# Patient Record
Sex: Female | Born: 1945 | Race: White | Hispanic: No | Marital: Married | Smoking: Former smoker
Health system: Southern US, Community
[De-identification: ages and names within clinical notes are randomized; demographics above are authoritative.]

## PROBLEM LIST (undated history)

## (undated) DIAGNOSIS — M810 Age-related osteoporosis without current pathological fracture: Secondary | ICD-10-CM

## (undated) DIAGNOSIS — R928 Other abnormal and inconclusive findings on diagnostic imaging of breast: Secondary | ICD-10-CM

## (undated) DIAGNOSIS — M48061 Spinal stenosis, lumbar region without neurogenic claudication: Secondary | ICD-10-CM

## (undated) DIAGNOSIS — E785 Hyperlipidemia, unspecified: Secondary | ICD-10-CM

## (undated) DIAGNOSIS — C801 Malignant (primary) neoplasm, unspecified: Secondary | ICD-10-CM

## (undated) DIAGNOSIS — R319 Hematuria, unspecified: Secondary | ICD-10-CM

## (undated) DIAGNOSIS — M1990 Unspecified osteoarthritis, unspecified site: Secondary | ICD-10-CM

## (undated) DIAGNOSIS — H409 Unspecified glaucoma: Secondary | ICD-10-CM

## (undated) DIAGNOSIS — I8393 Asymptomatic varicose veins of bilateral lower extremities: Secondary | ICD-10-CM

## (undated) DIAGNOSIS — M8580 Other specified disorders of bone density and structure, unspecified site: Secondary | ICD-10-CM

---

## 2003-08-01 HISTORY — PX: BREAST EXCISIONAL BIOPSY: SUR124

## 2004-09-20 ENCOUNTER — Ambulatory Visit: Payer: Self-pay | Admitting: Internal Medicine

## 2004-11-01 ENCOUNTER — Ambulatory Visit: Payer: Self-pay | Admitting: Gastroenterology

## 2005-06-19 IMAGING — MG MM BREAST SURGICAL SPECIMEN
1 series · 1 of 1 positions shown · non-contrast
Comparison: none

REASON FOR EXAM: specimen

Procedure: LEFT BREAST SPECIMEN
 Specimen reveals a Kopans hookwire in a small breast specimen. No definite
microcalcifications are noted. It is suggested that close follow-up
mammography be performed if repeat biopsy is not performed.

[L CC]
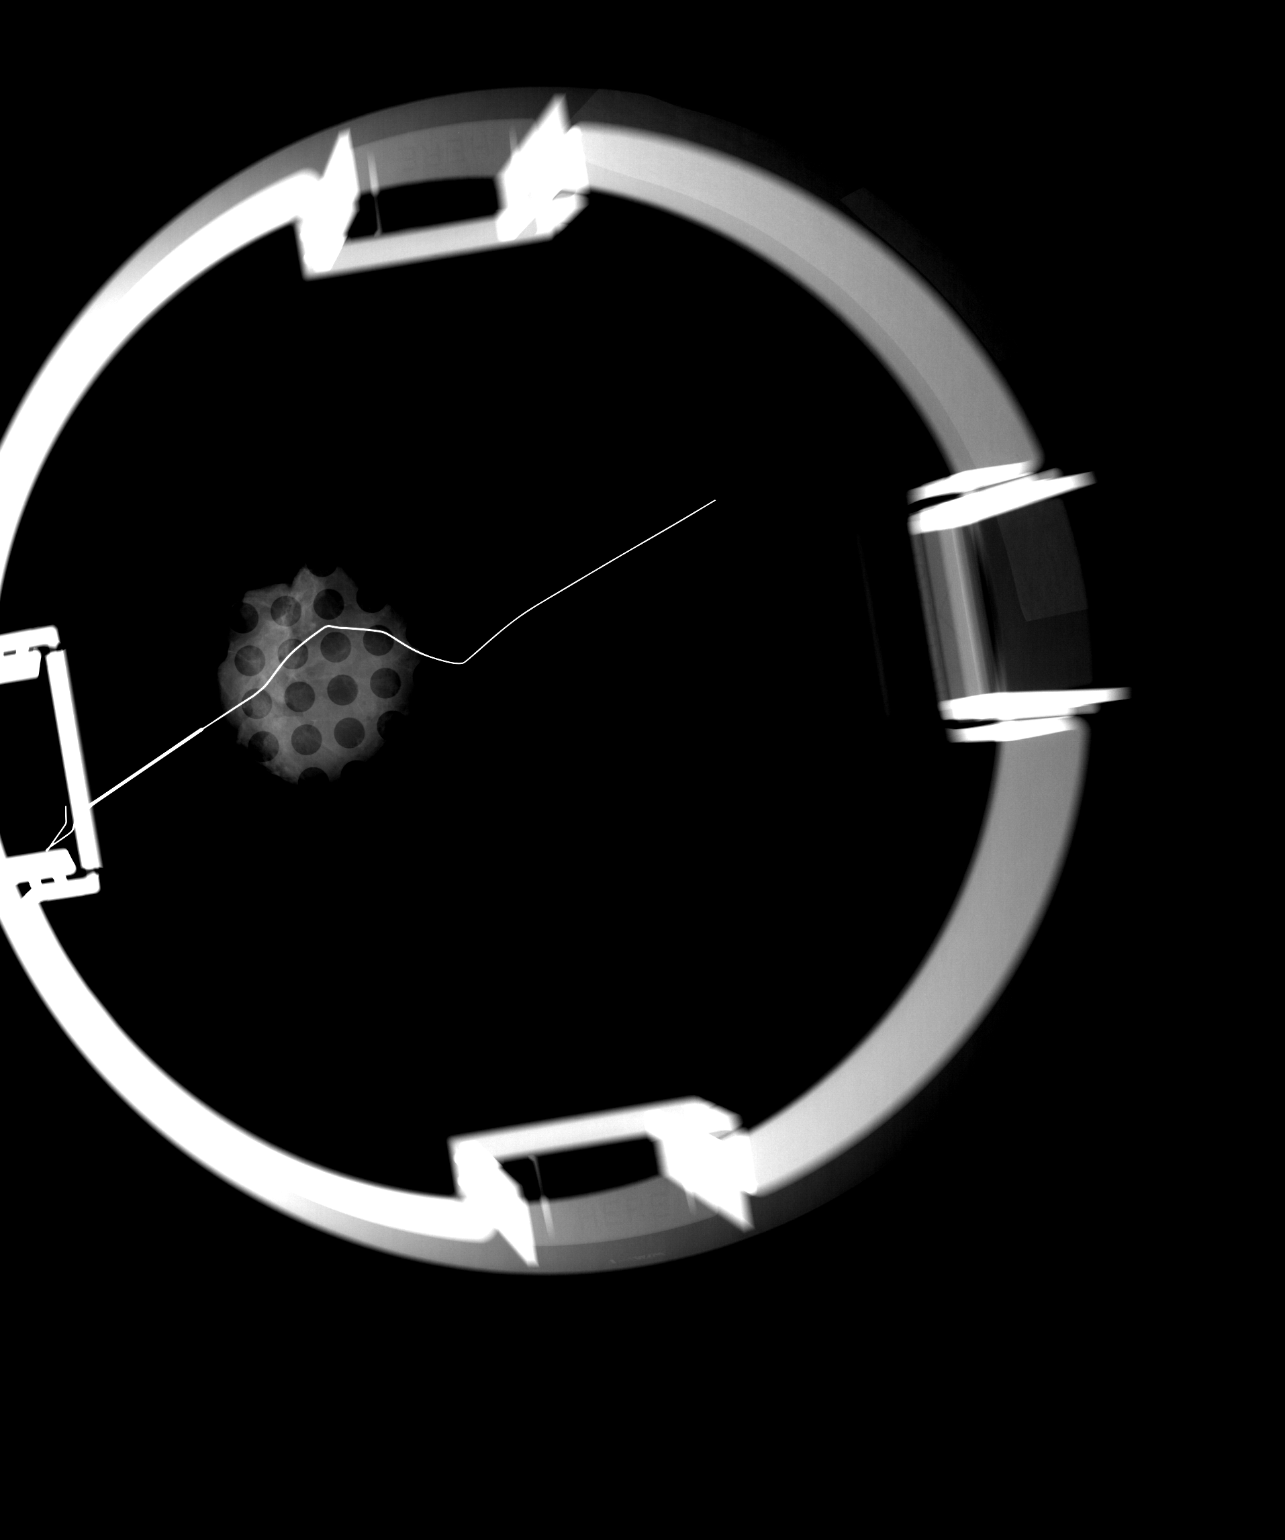

[1 of 1 positions shown; findings below may reference images not displayed]

## 2005-10-04 ENCOUNTER — Ambulatory Visit: Payer: Self-pay | Admitting: Internal Medicine

## 2006-10-09 ENCOUNTER — Ambulatory Visit: Payer: Self-pay | Admitting: Internal Medicine

## 2008-02-26 ENCOUNTER — Ambulatory Visit: Payer: Self-pay | Admitting: Internal Medicine

## 2008-11-18 ENCOUNTER — Ambulatory Visit: Payer: Self-pay | Admitting: Podiatry

## 2009-04-28 ENCOUNTER — Ambulatory Visit: Payer: Self-pay | Admitting: Internal Medicine

## 2010-01-11 ENCOUNTER — Ambulatory Visit: Payer: Self-pay | Admitting: Physician Assistant

## 2010-03-07 ENCOUNTER — Ambulatory Visit: Payer: Self-pay | Admitting: Gastroenterology

## 2010-05-03 ENCOUNTER — Ambulatory Visit: Payer: Self-pay | Admitting: Podiatry

## 2011-06-08 ENCOUNTER — Ambulatory Visit: Payer: Self-pay | Admitting: Internal Medicine

## 2012-06-13 ENCOUNTER — Ambulatory Visit: Payer: Self-pay | Admitting: Internal Medicine

## 2013-06-16 ENCOUNTER — Ambulatory Visit: Payer: Self-pay | Admitting: Internal Medicine

## 2014-06-17 ENCOUNTER — Ambulatory Visit: Payer: Self-pay | Admitting: Internal Medicine

## 2015-04-16 ENCOUNTER — Encounter: Payer: Self-pay | Admitting: *Deleted

## 2015-04-19 ENCOUNTER — Ambulatory Visit: Payer: Medicare Other | Admitting: Anesthesiology

## 2015-04-19 ENCOUNTER — Ambulatory Visit
Admission: RE | Admit: 2015-04-19 | Discharge: 2015-04-19 | Disposition: A | Discharge status: Home / Self Care | Payer: Medicare Other | Source: Ambulatory Visit | Attending: Gastroenterology | Admitting: Gastroenterology

## 2015-04-19 ENCOUNTER — Encounter: Payer: Self-pay | Admitting: Anesthesiology

## 2015-04-19 ENCOUNTER — Encounter
Admission: RE | Disposition: A | Discharge status: Home / Self Care | Payer: Self-pay | Source: Ambulatory Visit | Attending: Gastroenterology

## 2015-04-19 DIAGNOSIS — Z79899 Other long term (current) drug therapy: Secondary | ICD-10-CM | POA: Insufficient documentation

## 2015-04-19 DIAGNOSIS — K5520 Angiodysplasia of colon without hemorrhage: Secondary | ICD-10-CM | POA: Insufficient documentation

## 2015-04-19 DIAGNOSIS — Z86010 Personal history of colonic polyps: Secondary | ICD-10-CM | POA: Insufficient documentation

## 2015-04-19 DIAGNOSIS — Z800 Family history of malignant neoplasm of digestive organs: Secondary | ICD-10-CM | POA: Diagnosis not present

## 2015-04-19 DIAGNOSIS — H409 Unspecified glaucoma: Secondary | ICD-10-CM | POA: Insufficient documentation

## 2015-04-19 HISTORY — DX: Unspecified glaucoma: H40.9

## 2015-04-19 HISTORY — PX: COLONOSCOPY WITH PROPOFOL: SHX5780

## 2015-04-19 SURGERY — COLONOSCOPY WITH PROPOFOL
Anesthesia: General

## 2015-04-19 MED ORDER — SODIUM CHLORIDE 0.9 % IV SOLN
INTRAVENOUS | Status: DC
Start: 2015-04-19 — End: 2015-04-19

## 2015-04-19 MED ORDER — LIDOCAINE HCL (PF) 1 % IJ SOLN
2.0000 mL | Freq: Once | INTRAMUSCULAR | Status: AC
  Administered 2015-04-19: 0.03 mL via INTRADERMAL

## 2015-04-19 MED ORDER — LIDOCAINE HCL (CARDIAC) 20 MG/ML IV SOLN
INTRAVENOUS | Status: DC | PRN
  Administered 2015-04-19: 60 mg via INTRAVENOUS

## 2015-04-19 MED ORDER — LIDOCAINE HCL (PF) 1 % IJ SOLN
INTRAMUSCULAR | Status: AC
  Administered 2015-04-19: 0.03 mL via INTRADERMAL
  Filled 2015-04-19: qty 2

## 2015-04-19 MED ORDER — PROPOFOL 10 MG/ML IV BOLUS
INTRAVENOUS | Status: DC | PRN
  Administered 2015-04-19: 50 mg via INTRAVENOUS

## 2015-04-19 MED ORDER — PROPOFOL INFUSION 10 MG/ML OPTIME
INTRAVENOUS | Status: DC | PRN
Start: 2015-04-19 — End: 2015-04-19
  Administered 2015-04-19: 160 ug/kg/min via INTRAVENOUS

## 2015-04-19 MED ORDER — SODIUM CHLORIDE 0.9 % IV SOLN
INTRAVENOUS | Status: DC
Start: 2015-04-19 — End: 2015-04-19
  Administered 2015-04-19: 1000 mL via INTRAVENOUS

## 2015-04-19 NOTE — H&P (Signed)
    Primary Care Physician:  Adin Hector, MD Primary Gastroenterologist:  Dr. Candace Cruise  Pre-Procedure History & Physical: HPI:  Barbara Boyer is a 69 y.o. female is here for an colonoscopy.   Past Medical History  Diagnosis Date  . Glaucoma     No past surgical history on file.  Prior to Admission medications   Medication Sig Start Date End Date Taking? Authorizing Provider  Biotin 5000 MCG CAPS Take by mouth.   Yes Historical Provider, MD  timolol (BETIMOL) 0.5 % ophthalmic solution 1 drop 2 (two) times daily.   Yes Historical Provider, MD    Allergies as of 03/10/2015  . (Not on File)    No family history on file.  Social History   Social History  . Marital Status: Married    Spouse Name: N/A  . Number of Children: N/A  . Years of Education: N/A   Occupational History  . Not on file.   Social History Main Topics  . Smoking status: Not on file  . Smokeless tobacco: Not on file  . Alcohol Use: Not on file  . Drug Use: Not on file  . Sexual Activity: Not on file   Other Topics Concern  . Not on file   Social History Narrative  . No narrative on file    Review of Systems: See HPI, otherwise negative ROS  Physical Exam: BP 130/57 mmHg  Pulse 71  Temp(Src) 97.8 F (36.6 C) (Tympanic)  Resp 17  Ht 5\' 3"  (1.6 m)  Wt 70.761 kg (156 lb)  BMI 27.64 kg/m2  SpO2 100% General:   Alert,  pleasant and cooperative in NAD Head:  Normocephalic and atraumatic. Neck:  Supple; no masses or thyromegaly. Lungs:  Clear throughout to auscultation.    Heart:  Regular rate and rhythm. Abdomen:  Soft, nontender and nondistended. Normal bowel sounds, without guarding, and without rebound.   Neurologic:  Alert and  oriented x4;  grossly normal neurologically.  Impression/Plan: Barbara Boyer is here for an colonoscopy to be performed for persona hx of colon polyps and family hx of colon cancer.  Risks, benefits, limitations, and alternatives regarding colonoscopy have  been reviewed with the patient.  Questions have been answered.  All parties agreeable.   OH, Lupita Dawn, MD  04/19/2015, 8:31 AM

## 2015-04-19 NOTE — Anesthesia Preprocedure Evaluation (Signed)
Anesthesia Evaluation  Patient identified by MRN, date of birth, ID band Patient awake    Reviewed: Allergy & Precautions, H&P , NPO status , Patient's Chart, lab work & pertinent test results, reviewed documented beta blocker date and time   History of Anesthesia Complications Negative for: history of anesthetic complications  Airway Mallampati: III  TM Distance: >3 FB Neck ROM: full    Dental no notable dental hx. (+) Partial Lower 2 permanent bridges on the top left and right:   Pulmonary neg shortness of breath, neg sleep apnea, neg COPD, neg recent URI, former smoker,    Pulmonary exam normal breath sounds clear to auscultation       Cardiovascular Exercise Tolerance: Good negative cardio ROS Normal cardiovascular exam Rhythm:regular Rate:Normal     Neuro/Psych negative neurological ROS  negative psych ROS   GI/Hepatic negative GI ROS, Neg liver ROS,   Endo/Other  negative endocrine ROS  Renal/GU negative Renal ROS  negative genitourinary   Musculoskeletal   Abdominal   Peds  Hematology negative hematology ROS (+)   Anesthesia Other Findings Past Medical History:   Glaucoma                                                     Reproductive/Obstetrics negative OB ROS                             Anesthesia Physical Anesthesia Plan  ASA: II  Anesthesia Plan: General   Post-op Pain Management:    Induction:   Airway Management Planned:   Additional Equipment:   Intra-op Plan:   Post-operative Plan:   Informed Consent: I have reviewed the patients History and Physical, chart, labs and discussed the procedure including the risks, benefits and alternatives for the proposed anesthesia with the patient or authorized representative who has indicated his/her understanding and acceptance.   Dental Advisory Given  Plan Discussed with: Anesthesiologist, CRNA and  Surgeon  Anesthesia Plan Comments:         Anesthesia Quick Evaluation

## 2015-04-19 NOTE — Transfer of Care (Signed)
Immediate Anesthesia Transfer of Care Note  Patient: Barbara Boyer  Procedure(s) Performed: Procedure(s): COLONOSCOPY WITH PROPOFOL (N/A)  Patient Location: PACU  Anesthesia Type:General  Level of Consciousness: awake, alert  and oriented  Airway & Oxygen Therapy: Patient Spontanous Breathing and Patient connected to nasal cannula oxygen  Post-op Assessment: Report given to RN and Post -op Vital signs reviewed and stable  Post vital signs: Reviewed and stable  Last Vitals:  Filed Vitals:   04/19/15 0924  BP: 101/59  Pulse: 73  Temp: 36.4 C  Resp: 19    Complications: No apparent anesthesia complications

## 2015-04-19 NOTE — Op Note (Signed)
[facility] Gastroenterology Patient Name: Barbara Boyer Procedure Date: 04/19/2015 8:58 AM MRN: [id number] Account #: [id number] Date of Birth: [birth year] Admit Type: Outpatient Age: 69 Room: [facility] ENDO ROOM 4 Gender: Female Note Status: Finalized Procedure:         Colonoscopy Indications:       Family history of colon cancer in a first-degree relative,                     Personal history of colonic polyps Providers:         Lupita Dawn. Candace Cruise, MD Referring MD:      Ramonita Lab, MD (Referring MD) Medicines:         Monitored Anesthesia Care Complications:     No immediate complications. Procedure:         Pre-Anesthesia Assessment:                    - Prior to the procedure, a History and Physical was                     performed, and patient medications, allergies and                     sensitivities were reviewed. The patient's tolerance of                     previous anesthesia was reviewed.                    - The risks and benefits of the procedure and the sedation                     options and risks were discussed with the patient. All                     questions were answered and informed consent was obtained.                    - After reviewing the risks and benefits, the patient was                     deemed in satisfactory condition to undergo the procedure.                    After obtaining informed consent, the colonoscope was                     passed under direct vision. Throughout the procedure, the                     patient's blood pressure, pulse, and oxygen saturations                     were monitored continuously. The Olympus CF-H180AL                     colonoscope ( S#: [id number] ) was introduced through the                     anus and advanced to the the cecum, identified by                     appendiceal orifice and ileocecal valve. The colonoscopy  was performed without difficulty. The patient tolerated               the procedure well. The quality of the bowel preparation                     was good. Findings:      A single medium-sized angiodysplastic lesion without bleeding was found       in the cecum.      The exam was otherwise without abnormality. Impression:        - A single non-bleeding colonic angiodysplastic lesion.                    - The examination was otherwise normal.                    - No specimens collected. Recommendation:    - Discharge patient to home.                    - Repeat colonoscopy in 5 years for surveillance.                    - The findings and recommendations were discussed with the                     patient. Procedure Code(s): --- Professional ---                    [phone], Colonoscopy, flexible; diagnostic, including                     collection of specimen(s) by brushing or washing, when                     performed (separate procedure) Diagnosis Code(s): --- Professional ---                    K55.20, Angiodysplasia of colon without hemorrhage                    Z80.0, Family history of malignant neoplasm of digestive                     organs                    Z86.010, Personal history of colonic polyps CPT copyright 2014 American Medical Association. All rights reserved. The codes documented in this report are preliminary and upon coder review may  be revised to meet current compliance requirements. Hulen Luster, MD 04/19/2015 9:21:15 AM This report has been signed electronically. Number of Addenda: 0 Note Initiated On: 04/19/2015 8:58 AM Scope Withdrawal Time: 0 hours 3 minutes 29 seconds  Total Procedure Duration: 0 hours 7 minutes 45 seconds       [facility]

## 2015-04-20 NOTE — Anesthesia Postprocedure Evaluation (Signed)
  Anesthesia Post-op Note  Patient: Barbara Boyer  Procedure(s) Performed: Procedure(s): COLONOSCOPY WITH PROPOFOL (N/A)  Anesthesia type:General  Patient location: PACU  Post pain: Pain level controlled  Post assessment: Post-op Vital signs reviewed, Patient's Cardiovascular Status Stable, Respiratory Function Stable, Patent Airway and No signs of Nausea or vomiting  Post vital signs: Reviewed and stable  Last Vitals:  Filed Vitals:   04/19/15 1000  BP: 118/59  Pulse: 61  Temp:   Resp: 13    Level of consciousness: awake, alert  and patient cooperative  Complications: No apparent anesthesia complications

## 2015-04-21 ENCOUNTER — Encounter: Payer: Self-pay | Admitting: Gastroenterology

## 2015-04-22 ENCOUNTER — Other Ambulatory Visit: Payer: Self-pay | Admitting: Internal Medicine

## 2015-04-22 DIAGNOSIS — Z1231 Encounter for screening mammogram for malignant neoplasm of breast: Secondary | ICD-10-CM

## 2015-06-21 ENCOUNTER — Other Ambulatory Visit: Payer: Self-pay | Admitting: Internal Medicine

## 2015-06-21 ENCOUNTER — Ambulatory Visit
Admission: RE | Admit: 2015-06-21 | Discharge: 2015-06-21 | Disposition: A | Discharge status: Home / Self Care | Payer: Medicare Other | Source: Ambulatory Visit | Attending: Internal Medicine | Admitting: Internal Medicine

## 2015-06-21 DIAGNOSIS — Z1231 Encounter for screening mammogram for malignant neoplasm of breast: Secondary | ICD-10-CM | POA: Insufficient documentation

## 2016-05-18 ENCOUNTER — Other Ambulatory Visit: Payer: Self-pay | Admitting: Internal Medicine

## 2016-05-18 DIAGNOSIS — Z1231 Encounter for screening mammogram for malignant neoplasm of breast: Secondary | ICD-10-CM

## 2016-06-27 ENCOUNTER — Ambulatory Visit
Admission: RE | Admit: 2016-06-27 | Discharge: 2016-06-27 | Disposition: A | Discharge status: Home / Self Care | Payer: Medicare Other | Source: Ambulatory Visit | Attending: Internal Medicine | Admitting: Internal Medicine

## 2016-06-27 DIAGNOSIS — Z1231 Encounter for screening mammogram for malignant neoplasm of breast: Secondary | ICD-10-CM | POA: Insufficient documentation

## 2016-11-28 ENCOUNTER — Ambulatory Visit (INDEPENDENT_AMBULATORY_CARE_PROVIDER_SITE_OTHER): Payer: Medicare Other | Admitting: Vascular Surgery

## 2016-11-28 ENCOUNTER — Encounter (INDEPENDENT_AMBULATORY_CARE_PROVIDER_SITE_OTHER): Payer: Self-pay | Admitting: Vascular Surgery

## 2016-11-28 VITALS — BP 138/73 | HR 59 | Resp 16 | Ht 63.0 in | Wt 156.0 lb

## 2016-11-28 DIAGNOSIS — H409 Unspecified glaucoma: Secondary | ICD-10-CM | POA: Insufficient documentation

## 2016-11-28 DIAGNOSIS — I83813 Varicose veins of bilateral lower extremities with pain: Secondary | ICD-10-CM | POA: Diagnosis not present

## 2016-11-28 DIAGNOSIS — M7989 Other specified soft tissue disorders: Secondary | ICD-10-CM

## 2016-11-28 NOTE — Assessment & Plan Note (Signed)
Long standing and stable 

## 2016-11-28 NOTE — Patient Instructions (Signed)

## 2016-11-28 NOTE — Progress Notes (Signed)
Patient ID: Barbara Boyer, female   DOB: 09/24/1945, 71 y.o.   MRN: [id number]  Chief Complaint  Patient presents with  . New Evaluation    Varicose Veins    HPI Barbara Boyer is a 71 y.o. female. The patient presents with complaints of symptomatic varicosities of the Lower extremities bilaterally. The patient reports a long standing history of varicosities and they have become painful over time. She is actually seen to vascular specialists over the past 20 or 25 years and no intervention is recommended at this times but her symptoms have significantly worsened since the last time she was seen 10 or so years ago. There was no clear inciting event or causative factor that started the symptoms.  The left leg is more severly affected. The patient elevates the legs for relief. The pain is described as aching and stinging. The symptoms are generally most severe in the evening, particularly when they have been on their feet for long periods of time. Mild compression stockings and exercise has been used to try to improve the symptoms with some success. The patient complains of prominent swelling as an associated symptom most every day. The patient has no previous history of deep venous thrombosis or superficial thrombophlebitis to their knowledge.     Past Medical History:  Diagnosis Date  . Glaucoma     Past Surgical History:  Procedure Laterality Date  . BREAST EXCISIONAL BIOPSY Left 2005  . COLONOSCOPY WITH PROPOFOL N/A 04/19/2015   Procedure: COLONOSCOPY WITH PROPOFOL;  Surgeon: Hulen Luster, MD;  Location: [facility] ENDOSCOPY;  Service: Gastroenterology;  Laterality: N/A;    Family History  Problem Relation Age of Onset  . Breast cancer Maternal Aunt   . Breast cancer Maternal Aunt   No bleeding disorders, clotting disorders, or autoimmune diseases  Social History Social History  Substance Use Topics  . Smoking status: Former Research scientist (life sciences)  . Smokeless tobacco: Never Used     Comment: quit  at age 9  . Alcohol use 4.2 oz/week    7 Glasses of wine per week  No IV drug use Married  Allergies  Allergen Reactions  . Travatan Z [Travoprost]     Current Outpatient Prescriptions  Medication Sig Dispense Refill  . Biotin 5000 MCG CAPS Take by mouth.    . Calcium Carbonate-Vitamin D (CALCIUM 500 + D PO) Take by mouth.    . timolol (BETIMOL) 0.5 % ophthalmic solution 1 drop 2 (two) times daily.     No current facility-administered medications for this visit.       REVIEW OF SYSTEMS (Negative unless checked)  Constitutional: [] Weight loss  [] Fever  [] Chills Cardiac: [] Chest pain   [] Chest pressure   [] Palpitations   [] Shortness of breath when laying flat   [] Shortness of breath at rest   [] Shortness of breath with exertion. Vascular:  [] Pain in legs with walking   [x] Pain in legs at rest   [] Pain in legs when laying flat   [] Claudication   [] Pain in feet when walking  [] Pain in feet at rest  [] Pain in feet when laying flat   [] History of DVT   [] Phlebitis   [x] Swelling in legs   [x] Varicose veins   [] Non-healing ulcers Pulmonary:   [] Uses home oxygen   [] Productive cough   [] Hemoptysis   [] Wheeze  [] COPD   [] Asthma Neurologic:  [] Dizziness  [] Blackouts   [] Seizures   [] History of stroke   [] History of TIA  [] Aphasia   [] Temporary  blindness   [] Dysphagia   [] Weakness or numbness in arms   [] Weakness or numbness in legs Musculoskeletal:  [x] Arthritis   [] Joint swelling   [] Joint pain   [] Low back pain Hematologic:  [] Easy bruising  [] Easy bleeding   [] Hypercoagulable state   [] Anemic  [] Hepatitis Gastrointestinal:  [] Blood in stool   [] Vomiting blood  [] Gastroesophageal reflux/heartburn   [] Abdominal pain Genitourinary:  [] Chronic kidney disease   [] Difficult urination  [] Frequent urination  [] Burning with urination   [] Hematuria Skin:  [] Rashes   [] Ulcers   [] Wounds Psychological:  [] History of anxiety   []  History of major depression.    Physical Exam BP 138/73 (BP  Location: Right Arm)   Pulse (!) 59   Resp 16   Ht 5\' 3"  (1.6 m)   Wt 156 lb (70.8 kg)   BMI 27.63 kg/m  Gen:  WD/WN, NAD. Appears younger than stated age Head: [place]/AT, No temporalis wasting.  Ear/Nose/Throat: Hearing grossly intact, dentition good Eyes: Sclera non-icteric. Conjunctiva clear Neck: Supple, no nuchal rigidity. Trachea midline Pulmonary:  Good air movement, no use of accessory muscles, respirations not labored.  Cardiac: RRR, No JVD Vascular: Varicosities extensive and measuring up to 4-5 mm in the right lower extremity. Largest patches in the medial thigh area        Varicosities diffuse and measuring up to 4-5 mm in the left lower extremity. Largest patches in the posterior upper calf area Vessel Right Left  Radial Palpable Palpable  Ulnar Palpable Palpable  Brachial Palpable Palpable  Carotid Palpable, without bruit Palpable, without bruit  Aorta Not palpable N/A  Femoral Palpable Palpable  Popliteal Palpable Palpable  PT Palpable Palpable  DP Palpable Palpable   Gastrointestinal: soft, non-tender/non-distended.  Musculoskeletal: M/S 5/5 throughout.   1 + RLE edema.  1-2 + LLE edema Neurologic: Sensation grossly intact in extremities.  Symmetrical.  Speech is fluent.  Psychiatric: Judgment intact, Mood & affect appropriate for pt's clinical situation. Dermatologic: No rashes or ulcers noted.  No cellulitis or open wounds.    Radiology No results found.  Labs No results found for this or any previous visit (from the past 2160 hour(s)).  Assessment/Plan:  Glaucoma Longstanding and stable  Swelling of limb Likely from venous disease.  See treatment plan as below.  Varicose veins of leg with pain, bilateral See treatment plan as below.    The patient has symptoms consistent with chronic venous insufficiency. We discussed the natural history and treatment options for venous disease. I recommended the regular use of 20 - 30 mm Hg compression stockings,  and prescribed these today. I recommended leg elevation and anti-inflammatories as needed for pain. I have also recommended a complete venous duplex to assess the venous system for reflux or thrombotic issues. This can be done at the patient's convenience. I will see the patient back in 3 months to assess the response to conservative management, and determine further treatment options.     Leotis Pain 11/28/2016, 11:37 AM   This note was created with Dragon medical transcription system.  Any errors from dictation are unintentional.

## 2016-11-28 NOTE — Assessment & Plan Note (Signed)
Likely from venous disease.  See treatment plan as below.

## 2016-11-28 NOTE — Assessment & Plan Note (Signed)
See treatment plan as below 

## 2017-03-05 ENCOUNTER — Ambulatory Visit (INDEPENDENT_AMBULATORY_CARE_PROVIDER_SITE_OTHER): Payer: Medicare Other | Admitting: Vascular Surgery

## 2017-03-05 ENCOUNTER — Ambulatory Visit (INDEPENDENT_AMBULATORY_CARE_PROVIDER_SITE_OTHER): Payer: Medicare Other

## 2017-03-05 DIAGNOSIS — I83813 Varicose veins of bilateral lower extremities with pain: Secondary | ICD-10-CM | POA: Diagnosis not present

## 2017-04-06 ENCOUNTER — Encounter (INDEPENDENT_AMBULATORY_CARE_PROVIDER_SITE_OTHER): Payer: Self-pay | Admitting: Vascular Surgery

## 2017-04-06 ENCOUNTER — Ambulatory Visit (INDEPENDENT_AMBULATORY_CARE_PROVIDER_SITE_OTHER): Payer: Medicare Other | Admitting: Vascular Surgery

## 2017-04-06 VITALS — BP 141/75 | HR 71 | Resp 14 | Ht 62.0 in | Wt 153.0 lb

## 2017-04-06 DIAGNOSIS — H409 Unspecified glaucoma: Secondary | ICD-10-CM | POA: Diagnosis not present

## 2017-04-06 DIAGNOSIS — M7989 Other specified soft tissue disorders: Secondary | ICD-10-CM | POA: Diagnosis not present

## 2017-04-06 DIAGNOSIS — I83813 Varicose veins of bilateral lower extremities with pain: Secondary | ICD-10-CM | POA: Diagnosis not present

## 2017-04-06 NOTE — Patient Instructions (Signed)
Varicose Vein Surgery, Care After Refer to this sheet in the next few weeks. These instructions provide you with information about caring for yourself after your procedure. Your health care provider may also give you more specific instructions. Your treatment has been planned according to current medical practices, but problems sometimes occur. Call your health care provider if you have any problems or questions after your procedure. What can I expect after the procedure? After your procedure, it is typical to have the following:  Swelling.  Bruising.  Soreness.  Mild skin discoloration.  Slight bleeding at incision sites.  Follow these instructions at home:  Take medicines only as directed by your health care provider.  Wear compression stockings as directed by your health care provider. These stockings help to prevent blood clots and reduce swelling in your legs.  There are many different ways to close and cover an incision, including stitches (sutures), skin glue, and adhesive strips. Follow your health care provider's instructions on: ? Incision care. ? Bandage (dressing) changes and removal. ? Incision closure removal.  Wear loose-fitting clothing.  Get regular daily exercise. Walk or ride a stationary bike daily or as directed by your health care provider.  Ask your health care provider when you can return to work. This may depend on the type of work you do.  Be patient with your recovery. It can take up to 4 weeks to get back to your usual activities. Contact a health care provider if:  You have a fever.  You have drainage, redness, swelling, or pain at an incision site.  You develop a cough. Get help right away if:  You pass out.  You have very bad pain in your leg.  You have leg pain that gets worse when you walk.  You have redness or swelling in your leg that is getting worse.  You have trouble breathing.  You cough up blood. This information is not  intended to replace advice given to you by your health care provider. Make sure you discuss any questions you have with your health care provider. Document Released: 03/20/2014 Document Revised: 12/23/2015 Document Reviewed: 12/24/2013 Elsevier Interactive Patient Education  2018 [employer]  

## 2017-04-06 NOTE — Progress Notes (Signed)
Patient ID: Barbara Boyer, female   DOB: [birth year], 71 y.o.   MRN: [id number]  Chief Complaint  Patient presents with  . Follow-up    Ultrasound follow up    HPI Barbara Boyer is a 71 y.o. female.  Patient returns in follow up of their venous disease.  They have done their best to comply with the prescribed conservative therapies of compression stockings, leg elevation, exercise, and still requires anti-inflammatories for discomfort and has symptoms that are persistent and bothersome on a daily basis, affecting their activities of daily living and normal activities. Her symptom improvement is mild at best. Her left leg continues to have more discomfort and swelling. The venous reflux study demonstrates no DVT or superficial thrombophlebitis. Venous reflux was present in both small saphenous veins and the left great saphenous vein.         Past Medical History:  Diagnosis Date  . Glaucoma          Past Surgical History:  Procedure Laterality Date  . BREAST EXCISIONAL BIOPSY Left 2005  . COLONOSCOPY WITH PROPOFOL N/A 04/19/2015   Procedure: COLONOSCOPY WITH PROPOFOL;  Surgeon: Hulen Luster, MD;  Location: [facility] ENDOSCOPY;  Service: Gastroenterology;  Laterality: N/A;         Family History  Problem Relation Age of Onset  . Breast cancer Maternal Aunt   . Breast cancer Maternal Aunt   No bleeding disorders, clotting disorders, or autoimmune diseases  Social History        Social History   Substance Use Topics   . Smoking status: Former Research scientist (life sciences)   . Smokeless tobacco: Never Used      Comment: quit at age 78   . Alcohol use 4.2 oz/week    7 Glasses of wine per week  No IV drug use Married      Allergies  Allergen Reactions  . Travatan Z [Travoprost]           Current Outpatient Prescriptions  Medication Sig Dispense Refill  . Biotin 5000 MCG CAPS Take by mouth.    . Calcium Carbonate-Vitamin D (CALCIUM 500 + D PO) Take by mouth.    . timolol  (BETIMOL) 0.5 % ophthalmic solution 1 drop 2 (two) times daily.     No current facility-administered medications for this visit.       REVIEW OF SYSTEMS (Negative unless checked)  Constitutional: [] Weight loss  [] Fever  [] Chills Cardiac: [] Chest pain   [] Chest pressure   [] Palpitations   [] Shortness of breath when laying flat   [] Shortness of breath at rest   [] Shortness of breath with exertion. Vascular:  [] Pain in legs with walking   [x] Pain in legs at rest   [] Pain in legs when laying flat   [] Claudication   [] Pain in feet when walking  [] Pain in feet at rest  [] Pain in feet when laying flat   [] History of DVT   [] Phlebitis   [x] Swelling in legs   [x] Varicose veins   [] Non-healing ulcers Pulmonary:   [] Uses home oxygen   [] Productive cough   [] Hemoptysis   [] Wheeze  [] COPD   [] Asthma Neurologic:  [] Dizziness  [] Blackouts   [] Seizures   [] History of stroke   [] History of TIA  [] Aphasia   [] Temporary blindness   [] Dysphagia   [] Weakness or numbness in arms   [] Weakness or numbness in legs Musculoskeletal:  [x] Arthritis   [] Joint swelling   [] Joint pain   [] Low back pain Hematologic:  [] Easy bruising  []   Easy bleeding   [] Hypercoagulable state   [] Anemic  [] Hepatitis Gastrointestinal:  [] Blood in stool   [] Vomiting blood  [] Gastroesophageal reflux/heartburn   [] Abdominal pain Genitourinary:  [] Chronic kidney disease   [] Difficult urination  [] Frequent urination  [] Burning with urination   [] Hematuria Skin:  [] Rashes   [] Ulcers   [] Wounds Psychological:  [] History of anxiety   []  History of major depression.    Physical Exam BP (!) 141/75 (BP Location: Right Arm)   Pulse 71   Resp 14   Ht 5\' 2"  (1.575 m)   Wt 69.4 kg (153 lb)   BMI 27.98 kg/m  Gen:  WD/WN, NAD Head: [place]/AT, No temporalis wasting.  Ear/Nose/Throat: Hearing grossly intact, oropharynx clear Eyes: Sclera non-icteric. Conjunctiva clear Neck: Supple. Trachea midline Pulmonary:  Good air movement, no use of  accessory muscles, respirations not labored.  Cardiac: RRR, No JVD Vascular: Varicosities extensive and measuring up to 4-5 mm in the right lower extremity        Varicosities diffuse and measuring up to 4-5 mm in the left lower extremity Vessel Right Left  Radial Palpable Palpable  Ulnar Palpable Palpable  Brachial Palpable Palpable  Carotid Palpable, without bruit Palpable, without bruit  Aorta Not palpable N/A  Femoral Palpable Palpable  Popliteal Palpable Palpable  PT Palpable Palpable  DP Palpable Palpable    Musculoskeletal: M/S 5/5 throughout.   1 + RLE edema.  1-2 + LLE edema Neurologic: Sensation grossly intact in extremities.  Symmetrical.  Speech is fluent.  Psychiatric: Judgment intact, Mood & affect appropriate for pt's clinical situation. Dermatologic: No rashes or ulcers noted.  No cellulitis or open wounds.    Radiology No results found.  Labs No results found for this or any previous visit (from the past 2160 hour(s)).  Assessment/Plan: Glaucoma Longstanding and stable  Swelling of limb Likely from venous disease.  See treatment plan as below.  Varicose veins of leg with pain, bilateral   The patient has done their best to comply with conservative therapy of 20-30 mm Hg compression stockings, leg elevation, exercise, and anti-inflammatories as needed for discomfort.  Despite this, they continue to have daily and persistent symptoms from their venous disease.  A venous reflux study demonstrates bilateral SSV reflux and left GSV reflux.  As such, the patient is likely to benefit from endovenous laser ablation of the GSV on the left and the SSV bilaterally.  Risks and benefits of the procedure including bleeding, infection, recanalization, DVT, and need for further therapy for residual varicosities were discussed.  The patient voices their understanding and is agreeable to proceed.     Leotis Pain 04/06/2017, 4:37 PM

## 2017-05-22 ENCOUNTER — Encounter (INDEPENDENT_AMBULATORY_CARE_PROVIDER_SITE_OTHER): Payer: Self-pay | Admitting: Vascular Surgery

## 2017-05-22 ENCOUNTER — Ambulatory Visit (INDEPENDENT_AMBULATORY_CARE_PROVIDER_SITE_OTHER): Payer: Medicare Other | Admitting: Vascular Surgery

## 2017-05-22 VITALS — BP 125/79 | HR 70 | Resp 15 | Ht 62.0 in | Wt 154.0 lb

## 2017-05-22 DIAGNOSIS — I83813 Varicose veins of bilateral lower extremities with pain: Secondary | ICD-10-CM

## 2017-05-22 DIAGNOSIS — I83811 Varicose veins of right lower extremities with pain: Secondary | ICD-10-CM | POA: Diagnosis not present

## 2017-05-22 DIAGNOSIS — I83812 Varicose veins of left lower extremities with pain: Secondary | ICD-10-CM | POA: Diagnosis not present

## 2017-05-22 NOTE — Progress Notes (Signed)
Varicose veins of leg with pain, bilateral     The patient's left lower extremity was sterilely prepped and draped. The ultrasound machine was used to visualize the saphenous vein throughout its course. A segment in the mid calf was selected for access. The saphenous vein was accessed without difficulty using ultrasound guidance with a micro puncture needle. A micro puncture wire and sheath were then placed. A 0.018 wire was placed beyond the saphenofemoral junction through the sheath and the micro puncture sheath was removed. The 65 cm sheath was then placed over the wire and the wire and dilator were removed. The laser fiber was placed through the sheath and its tip was placed approximately 4 cm below the saphenofemoral junction. Tumescent anesthesia was then created with a dilute lidocaine solution. Laser energy was then delivered with constant withdrawal of the sheath and laser fiber. Approximately 1571 joules of energy were delivered over a length of 39 cm using the 1470 Hz VenaCure machine at [employer]. Sterile dressings were placed. The patient tolerated the procedure well without complications.  The small saphenous vein ablation is dictated below.   .Varicose veins of leg with pain, bilateral     The patient's left lower extremity was sterilely prepped and draped. The ultrasound machine was used to visualize the lesser saphenous vein throughout its course. A segment in the mid to upper calf was selected for access. The lesser saphenous vein was accessed without difficulty using ultrasound guidance with a micro puncture needle. A 0.018 wire was placed beyond the small saphenous-popliteal junction. The 65cm sheath was placed over the wire and the wire and dilator were removed. The laser fiber was placed through the sheath and its tip was placed approximately 4 cm below the small saphenous-popliteal junction. Tumescent anesthesia was then created with a dilute lidocaine solution. Laser energy was then  delivered with constant withdrawal of the sheath and laser fiber. Approximately 481 joules of energy were delivered over a length of 12 cm using the 1470 Hz Venocare machine at [employer]. Sterile dressings were placed. The patient tolerated the procedure well without complications.

## 2017-05-25 ENCOUNTER — Ambulatory Visit (INDEPENDENT_AMBULATORY_CARE_PROVIDER_SITE_OTHER): Payer: Medicare Other

## 2017-05-25 DIAGNOSIS — I83813 Varicose veins of bilateral lower extremities with pain: Secondary | ICD-10-CM

## 2017-06-05 ENCOUNTER — Other Ambulatory Visit (INDEPENDENT_AMBULATORY_CARE_PROVIDER_SITE_OTHER): Payer: Medicare Other | Admitting: Vascular Surgery

## 2017-06-05 ENCOUNTER — Encounter (INDEPENDENT_AMBULATORY_CARE_PROVIDER_SITE_OTHER): Payer: Self-pay | Admitting: Vascular Surgery

## 2017-06-05 ENCOUNTER — Ambulatory Visit (INDEPENDENT_AMBULATORY_CARE_PROVIDER_SITE_OTHER): Payer: Medicare Other | Admitting: Vascular Surgery

## 2017-06-05 VITALS — BP 125/75 | HR 69 | Resp 15 | Wt 154.0 lb

## 2017-06-05 DIAGNOSIS — I83811 Varicose veins of right lower extremities with pain: Secondary | ICD-10-CM | POA: Diagnosis not present

## 2017-06-05 DIAGNOSIS — I83813 Varicose veins of bilateral lower extremities with pain: Secondary | ICD-10-CM

## 2017-06-05 NOTE — Progress Notes (Signed)
Barbara Boyer is a 71 y.o.female who presents with painful varicose veins of the right leg. The initial plan was for laser ablation of the right small saphenous vein, but on evaluation with ultrasound today this was extremely small and not large enough patient.  She had very large incompetent varicosities in the area that were superficial and would not be amenable to laser ablation, but would be appropriate for treatment with foam sclerotherapy.  Past Medical History:  Diagnosis Date  . Glaucoma     Past Surgical History:  Procedure Laterality Date  . BREAST EXCISIONAL BIOPSY Left 2005    Current Outpatient Medications  Medication Sig Dispense Refill  . alendronate (FOSAMAX) 70 MG tablet Take by mouth.    . Biotin 5000 MCG CAPS Take by mouth.    . Calcium Carbonate-Vitamin D (CALCIUM 500 + D PO) Take by mouth.    . Cholecalciferol (VITAMIN D-1000 MAX ST) 1000 units tablet Take by mouth.    . timolol (BETIMOL) 0.5 % ophthalmic solution 1 drop 2 (two) times daily.     No current facility-administered medications for this visit.     Allergies  Allergen Reactions  . Travatan Z [Travoprost]     Indication: Patient presents with symptomatic varicose veins of the right lower extremity.  Procedure: Foam sclerotherapy was performed on the right lower extremity. Using ultrasound guidance, 5 mL of foam Sotradecol was used to inject the varicosities of the right lower extremity. Compression wraps were placed. The patient tolerated the procedure well.

## 2017-06-08 ENCOUNTER — Encounter (INDEPENDENT_AMBULATORY_CARE_PROVIDER_SITE_OTHER): Payer: Medicare Other

## 2017-06-08 ENCOUNTER — Other Ambulatory Visit: Payer: Self-pay | Admitting: Internal Medicine

## 2017-06-08 DIAGNOSIS — Z1231 Encounter for screening mammogram for malignant neoplasm of breast: Secondary | ICD-10-CM

## 2017-06-26 ENCOUNTER — Ambulatory Visit (INDEPENDENT_AMBULATORY_CARE_PROVIDER_SITE_OTHER): Payer: Medicare Other | Admitting: Vascular Surgery

## 2017-06-26 ENCOUNTER — Encounter (INDEPENDENT_AMBULATORY_CARE_PROVIDER_SITE_OTHER): Payer: Self-pay | Admitting: Vascular Surgery

## 2017-06-26 VITALS — BP 129/69 | HR 77 | Resp 16 | Ht 63.0 in | Wt 155.0 lb

## 2017-06-26 DIAGNOSIS — I83813 Varicose veins of bilateral lower extremities with pain: Secondary | ICD-10-CM

## 2017-06-26 NOTE — Progress Notes (Signed)
Barbara Boyer is a 71 y.o.female who presents with painful varicose veins of the right leg  Past Medical History:  Diagnosis Date  . Glaucoma     Past Surgical History:  Procedure Laterality Date  . BREAST EXCISIONAL BIOPSY Left 2005  . COLONOSCOPY WITH PROPOFOL N/A 04/19/2015   Procedure: COLONOSCOPY WITH PROPOFOL;  Surgeon: Hulen Luster, MD;  Location: [facility] ENDOSCOPY;  Service: Gastroenterology;  Laterality: N/A;    Current Outpatient Medications  Medication Sig Dispense Refill  . alendronate (FOSAMAX) 70 MG tablet Take by mouth.    . Biotin 5000 MCG CAPS Take by mouth.    . Calcium Carbonate-Vitamin D (CALCIUM 500 + D PO) Take by mouth.    . Cholecalciferol (VITAMIN D-1000 MAX ST) 1000 units tablet Take by mouth.    . timolol (BETIMOL) 0.5 % ophthalmic solution 1 drop 2 (two) times daily.     No current facility-administered medications for this visit.     Allergies  Allergen Reactions  . Travatan Z [Travoprost]     Indication: Patient presents with symptomatic varicose veins of the right lower extremity.  Procedure: Foam sclerotherapy was performed on the right lower extremity. Using ultrasound guidance, 5 mL of foam Sotradecol was used to inject the varicosities of the right lower extremity. Compression wraps were placed. The patient tolerated the procedure well.

## 2017-07-03 ENCOUNTER — Ambulatory Visit
Admission: RE | Admit: 2017-07-03 | Discharge: 2017-07-03 | Disposition: A | Discharge status: Home / Self Care | Payer: Medicare Other | Source: Ambulatory Visit | Attending: Internal Medicine | Admitting: Internal Medicine

## 2017-07-03 DIAGNOSIS — Z1231 Encounter for screening mammogram for malignant neoplasm of breast: Secondary | ICD-10-CM | POA: Diagnosis present

## 2017-08-03 ENCOUNTER — Ambulatory Visit (INDEPENDENT_AMBULATORY_CARE_PROVIDER_SITE_OTHER): Payer: Medicare Other | Admitting: Vascular Surgery

## 2017-08-03 ENCOUNTER — Encounter (INDEPENDENT_AMBULATORY_CARE_PROVIDER_SITE_OTHER): Payer: Self-pay | Admitting: Vascular Surgery

## 2017-08-03 VITALS — BP 124/70 | HR 67 | Resp 16 | Wt 155.8 lb

## 2017-08-03 DIAGNOSIS — I83813 Varicose veins of bilateral lower extremities with pain: Secondary | ICD-10-CM

## 2017-08-03 NOTE — Progress Notes (Signed)
Barbara Boyer is a 72 y.o.female who presents with painful varicose veins of the right leg  Past Medical History:  Diagnosis Date  . Glaucoma     Past Surgical History:  Procedure Laterality Date  . BREAST EXCISIONAL BIOPSY Left 2005  . COLONOSCOPY WITH PROPOFOL N/A 04/19/2015   Procedure: COLONOSCOPY WITH PROPOFOL;  Surgeon: Hulen Luster, MD;  Location: [facility] ENDOSCOPY;  Service: Gastroenterology;  Laterality: N/A;    Current Outpatient Medications  Medication Sig Dispense Refill  . alendronate (FOSAMAX) 70 MG tablet Take by mouth.    . Biotin 5000 MCG CAPS Take by mouth.    . Calcium Carbonate-Vitamin D (CALCIUM 500 + D PO) Take by mouth.    . Cholecalciferol (VITAMIN D-1000 MAX ST) 1000 units tablet Take by mouth.    . timolol (BETIMOL) 0.5 % ophthalmic solution 1 drop 2 (two) times daily.     No current facility-administered medications for this visit.     Allergies  Allergen Reactions  . Travatan Z [Travoprost]     Indication: Patient presents with symptomatic varicose veins of the right lower extremity.  Procedure: Foam sclerotherapy was performed on the right lower extremity. Using ultrasound guidance, 5 mL of foam Sotradecol was used to inject the varicosities of the right lower extremity. Compression wraps were placed. The patient tolerated the procedure well.

## 2018-06-19 ENCOUNTER — Other Ambulatory Visit: Payer: Self-pay | Admitting: Internal Medicine

## 2018-06-19 DIAGNOSIS — Z1231 Encounter for screening mammogram for malignant neoplasm of breast: Secondary | ICD-10-CM

## 2018-07-12 ENCOUNTER — Ambulatory Visit
Admission: RE | Admit: 2018-07-12 | Discharge: 2018-07-12 | Disposition: A | Discharge status: Home / Self Care | Payer: Medicare Other | Source: Ambulatory Visit | Attending: Internal Medicine | Admitting: Internal Medicine

## 2018-07-12 DIAGNOSIS — Z1231 Encounter for screening mammogram for malignant neoplasm of breast: Secondary | ICD-10-CM | POA: Diagnosis not present

## 2019-06-17 ENCOUNTER — Other Ambulatory Visit: Payer: Self-pay | Admitting: Internal Medicine

## 2019-06-17 DIAGNOSIS — Z1231 Encounter for screening mammogram for malignant neoplasm of breast: Secondary | ICD-10-CM

## 2019-07-15 ENCOUNTER — Ambulatory Visit
Admission: RE | Admit: 2019-07-15 | Discharge: 2019-07-15 | Disposition: A | Discharge status: Home / Self Care | Payer: Medicare Other | Source: Ambulatory Visit | Attending: Internal Medicine | Admitting: Internal Medicine

## 2019-07-15 DIAGNOSIS — Z1231 Encounter for screening mammogram for malignant neoplasm of breast: Secondary | ICD-10-CM | POA: Diagnosis present

## 2019-07-15 HISTORY — DX: Malignant (primary) neoplasm, unspecified: C80.1

## 2020-06-08 ENCOUNTER — Other Ambulatory Visit: Payer: Self-pay | Admitting: Internal Medicine

## 2020-06-08 DIAGNOSIS — Z1231 Encounter for screening mammogram for malignant neoplasm of breast: Secondary | ICD-10-CM

## 2020-07-15 ENCOUNTER — Other Ambulatory Visit: Payer: Self-pay

## 2020-07-15 ENCOUNTER — Ambulatory Visit
Admission: RE | Admit: 2020-07-15 | Discharge: 2020-07-15 | Disposition: A | Discharge status: Home / Self Care | Payer: Medicare Other | Source: Ambulatory Visit | Attending: Internal Medicine | Admitting: Internal Medicine

## 2020-07-15 DIAGNOSIS — Z1231 Encounter for screening mammogram for malignant neoplasm of breast: Secondary | ICD-10-CM | POA: Diagnosis not present

## 2020-12-09 ENCOUNTER — Encounter: Payer: Self-pay | Admitting: *Deleted

## 2020-12-10 ENCOUNTER — Other Ambulatory Visit: Payer: Self-pay

## 2020-12-10 ENCOUNTER — Encounter
Admission: RE | Disposition: A | Discharge status: Home / Self Care | Payer: Self-pay | Source: Home / Self Care | Attending: Gastroenterology

## 2020-12-10 ENCOUNTER — Ambulatory Visit
Admission: RE | Admit: 2020-12-10 | Discharge: 2020-12-10 | Disposition: A | Discharge status: Home / Self Care | Payer: Medicare Other | Attending: Gastroenterology | Admitting: Gastroenterology

## 2020-12-10 ENCOUNTER — Ambulatory Visit: Payer: Medicare Other | Admitting: Certified Registered Nurse Anesthetist

## 2020-12-10 DIAGNOSIS — Z8719 Personal history of other diseases of the digestive system: Secondary | ICD-10-CM | POA: Insufficient documentation

## 2020-12-10 DIAGNOSIS — Z888 Allergy status to other drugs, medicaments and biological substances status: Secondary | ICD-10-CM | POA: Diagnosis not present

## 2020-12-10 DIAGNOSIS — Z79899 Other long term (current) drug therapy: Secondary | ICD-10-CM | POA: Diagnosis not present

## 2020-12-10 DIAGNOSIS — K5520 Angiodysplasia of colon without hemorrhage: Secondary | ICD-10-CM | POA: Insufficient documentation

## 2020-12-10 DIAGNOSIS — Z1211 Encounter for screening for malignant neoplasm of colon: Secondary | ICD-10-CM | POA: Diagnosis present

## 2020-12-10 DIAGNOSIS — D122 Benign neoplasm of ascending colon: Secondary | ICD-10-CM | POA: Diagnosis not present

## 2020-12-10 HISTORY — DX: Hyperlipidemia, unspecified: E78.5

## 2020-12-10 HISTORY — DX: Other specified disorders of bone density and structure, unspecified site: M85.80

## 2020-12-10 HISTORY — DX: Unspecified osteoarthritis, unspecified site: M19.90

## 2020-12-10 HISTORY — PX: COLONOSCOPY WITH PROPOFOL: SHX5780

## 2020-12-10 HISTORY — DX: Other abnormal and inconclusive findings on diagnostic imaging of breast: R92.8

## 2020-12-10 HISTORY — DX: Asymptomatic varicose veins of bilateral lower extremities: I83.93

## 2020-12-10 SURGERY — COLONOSCOPY WITH PROPOFOL
Anesthesia: General

## 2020-12-10 MED ORDER — PROPOFOL 500 MG/50ML IV EMUL
INTRAVENOUS | Status: DC | PRN
  Administered 2020-12-10: 50 ug/kg/min via INTRAVENOUS

## 2020-12-10 MED ORDER — GLYCOPYRROLATE 0.2 MG/ML IJ SOLN
INTRAMUSCULAR | Status: AC
  Filled 2020-12-10: qty 1

## 2020-12-10 MED ORDER — PROPOFOL 10 MG/ML IV BOLUS
INTRAVENOUS | Status: DC | PRN
  Administered 2020-12-10: 30 mg via INTRAVENOUS
  Administered 2020-12-10: 50 mg via INTRAVENOUS

## 2020-12-10 MED ORDER — SODIUM CHLORIDE 0.9 % IV SOLN
INTRAVENOUS | Status: DC
  Administered 2020-12-10: 20 mL/h via INTRAVENOUS

## 2020-12-10 MED ORDER — GLYCOPYRROLATE 0.2 MG/ML IJ SOLN
INTRAMUSCULAR | Status: DC | PRN
  Administered 2020-12-10: .2 mg via INTRAVENOUS

## 2020-12-10 MED ORDER — PHENYLEPHRINE HCL (PRESSORS) 10 MG/ML IV SOLN
INTRAVENOUS | Status: AC
  Filled 2020-12-10: qty 1

## 2020-12-10 MED ORDER — LIDOCAINE HCL (CARDIAC) PF 100 MG/5ML IV SOSY
PREFILLED_SYRINGE | INTRAVENOUS | Status: DC | PRN
  Administered 2020-12-10: 70 mg via INTRAVENOUS

## 2020-12-10 MED ORDER — PHENYLEPHRINE HCL (PRESSORS) 10 MG/ML IV SOLN
INTRAVENOUS | Status: DC | PRN
  Administered 2020-12-10 (×2): 100 ug via INTRAVENOUS
  Administered 2020-12-10 (×2): 200 ug via INTRAVENOUS
  Administered 2020-12-10: 100 ug via INTRAVENOUS
  Administered 2020-12-10 (×2): 200 ug via INTRAVENOUS

## 2020-12-10 MED ORDER — PROPOFOL 10 MG/ML IV BOLUS
INTRAVENOUS | Status: AC
  Filled 2020-12-10: qty 80

## 2020-12-10 NOTE — Interval H&P Note (Signed)
History and Physical Interval Note:  12/10/2020 9:19 AM  Barbara Boyer  has presented today for surgery, with the diagnosis of P HX POLYPS.  The various methods of treatment have been discussed with the patient and family. After consideration of risks, benefits and other options for treatment, the patient has consented to  Procedure(s): COLONOSCOPY WITH PROPOFOL (N/A) as a surgical intervention.  The patient's history has been reviewed, patient examined, no change in status, stable for surgery.  I have reviewed the patient's chart and labs.  Questions were answered to the patient's satisfaction.     Lesly Rubenstein  Ok to proceed with colonoscopy

## 2020-12-10 NOTE — H&P (Signed)
Outpatient short stay form Pre-procedure 12/10/2020 9:17 AM Raylene Miyamoto MD, MPH  Primary Physician: Dr. Caryl Comes  Reason for visit:  Surveillance colonoscopy  History of present illness:   75 y/o lady with history of polyps here for surveillance colonoscopy. No blood thinners. No abdominal surgeries. No family history of GI malignancies. No new GI symptoms.    Current Facility-Administered Medications:  .  0.9 %  sodium chloride infusion, , Intravenous, Continuous, Mahmoud Blazejewski, Hilton Cork, MD, Last Rate: 20 mL/hr at 12/10/20 0900, 20 mL/hr at 12/10/20 0900  Medications Prior to Admission  Medication Sig Dispense Refill Last Dose  . Biotin 5000 MCG CAPS Take by mouth.   Past Week at Unknown time  . Calcium Carbonate-Vitamin D (CALCIUM 500 + D PO) Take by mouth.   Past Week at Unknown time  . Cholecalciferol 25 MCG (1000 UT) tablet Take by mouth.   Past Week at Unknown time  . naproxen sodium (ALEVE) 220 MG tablet Take 220 mg by mouth daily as needed (pain).   Past Week at Unknown time  . timolol (BETIMOL) 0.5 % ophthalmic solution 1 drop 2 (two) times daily.   12/10/2020 at 0800     Allergies  Allergen Reactions  . Travatan Z [Travoprost]      Past Medical History:  Diagnosis Date  . Abnormal mammogram   . Cancer ([place])    skin  . Glaucoma   . Hyperlipidemia   . Osteoarthritis   . Osteopenia   . Varicose veins of both lower extremities     Review of systems:  Otherwise negative.    Physical Exam  Gen: Alert, oriented. Appears stated age.  HEENT: PERRLA. Lungs: No respiratory distress CV: RRR Abd: soft, benign, no masses Ext: No edema    Planned procedures: Proceed with colonoscopy. The patient understands the nature of the planned procedure, indications, risks, alternatives and potential complications including but not limited to bleeding, infection, perforation, damage to internal organs and possible oversedation/side effects from anesthesia. The patient agrees and  gives consent to proceed.  Please refer to procedure notes for findings, recommendations and patient disposition/instructions.     Raylene Miyamoto MD, MPH Gastroenterology 12/10/2020  9:17 AM

## 2020-12-10 NOTE — Transfer of Care (Signed)
Immediate Anesthesia Transfer of Care Note  Patient: Barbara Boyer  Procedure(s) Performed: COLONOSCOPY WITH PROPOFOL (N/A )  Patient Location: PACU and Endoscopy Unit  Anesthesia Type:General  Level of Consciousness: awake, drowsy and patient cooperative  Airway & Oxygen Therapy: Patient Spontanous Breathing  Post-op Assessment: Report given to RN and Post -op Vital signs reviewed and stable  Post vital signs: Reviewed and stable  Last Vitals:  Vitals Value Taken Time  BP 102/61 12/10/20 1029  Temp 36.8 C 12/10/20 1028  Pulse 74 12/10/20 1030  Resp 16 12/10/20 1030  SpO2 100 % 12/10/20 1030  Vitals shown include unvalidated device data.  Last Pain:  Vitals:   12/10/20 1028  TempSrc: Temporal  PainSc: 0-No pain         Complications: No complications documented.

## 2020-12-10 NOTE — Anesthesia Preprocedure Evaluation (Signed)
Anesthesia Evaluation  Patient identified by MRN, date of birth, ID band Patient awake    Reviewed: Allergy & Precautions, NPO status , Patient's Chart, lab work & pertinent test results  Airway Mallampati: II  TM Distance: >3 FB Neck ROM: Full    Dental no notable dental hx.    Pulmonary neg pulmonary ROS, former smoker,    Pulmonary exam normal        Cardiovascular negative cardio ROS Normal cardiovascular exam     Neuro/Psych negative neurological ROS  negative psych ROS   GI/Hepatic negative GI ROS, Neg liver ROS,   Endo/Other  negative endocrine ROS  Renal/GU negative Renal ROS  negative genitourinary   Musculoskeletal  (+) Arthritis , Osteoarthritis,    Abdominal   Peds negative pediatric ROS (+)  Hematology negative hematology ROS (+)   Anesthesia Other Findings   Reproductive/Obstetrics negative OB ROS                             Anesthesia Physical Anesthesia Plan  ASA: II  Anesthesia Plan: General   Post-op Pain Management:    Induction: Intravenous  PONV Risk Score and Plan: 2 and Propofol infusion and TIVA  Airway Management Planned: Natural Airway and Nasal Cannula  Additional Equipment:   Intra-op Plan:   Post-operative Plan:   Informed Consent: I have reviewed the patients History and Physical, chart, labs and discussed the procedure including the risks, benefits and alternatives for the proposed anesthesia with the patient or authorized representative who has indicated his/her understanding and acceptance.       Plan Discussed with: CRNA, Anesthesiologist and Surgeon  Anesthesia Plan Comments:         Anesthesia Quick Evaluation

## 2020-12-10 NOTE — Op Note (Signed)
[facility] Gastroenterology Patient Name: Barbara Boyer Procedure Date: 12/10/2020 9:24 AM MRN: [id number] Account #: [id number] Date of Birth: Mar 20, 1946 Admit Type: Outpatient Age: 75 Room: [facility] ENDO ROOM 3 Gender: Female Note Status: Finalized Procedure:             Colonoscopy Indications:           High risk colon cancer surveillance: Personal history                         of colonic polyps Providers:             Andrey Farmer MD, MD Referring MD:          Ramonita Lab, MD (Referring MD) Medicines:             Monitored Anesthesia Care Complications:         No immediate complications. Estimated blood loss:                         Minimal. Procedure:             Pre-Anesthesia Assessment:                        - Prior to the procedure, a History and Physical was                         performed, and patient medications and allergies were                         reviewed. The patient is competent. The risks and                         benefits of the procedure and the sedation options and                         risks were discussed with the patient. All questions                         were answered and informed consent was obtained.                         Patient identification and proposed procedure were                         verified by the physician, the nurse, the anesthetist                         and the technician in the endoscopy suite. Mental                         Status Examination: alert and oriented. Airway                         Examination: normal oropharyngeal airway and neck                         mobility. Respiratory Examination: clear to  auscultation. CV Examination: normal. Prophylactic                         Antibiotics: The patient does not require prophylactic                         antibiotics. Prior Anticoagulants: The patient has                         taken no previous anticoagulant or  antiplatelet                         agents. ASA Grade Assessment: II - A patient with mild                         systemic disease. After reviewing the risks and                         benefits, the patient was deemed in satisfactory                         condition to undergo the procedure. The anesthesia                         plan was to use monitored anesthesia care (MAC).                         Immediately prior to administration of medications,                         the patient was re-assessed for adequacy to receive                         sedatives. The heart rate, respiratory rate, oxygen                         saturations, blood pressure, adequacy of pulmonary                         ventilation, and response to care were monitored                         throughout the procedure. The physical status of the                         patient was re-assessed after the procedure.                        After obtaining informed consent, the colonoscope was                         passed under direct vision. Throughout the procedure,                         the patient's blood pressure, pulse, and oxygen                         saturations were monitored continuously. The  Colonoscope was introduced through the anus and                         advanced to the the cecum, identified by appendiceal                         orifice and ileocecal valve. The colonoscopy was                         performed without difficulty. The patient tolerated                         the procedure well. The quality of the bowel                         preparation was good. Findings:      The perianal and digital rectal examinations were normal.      A 17 mm polyp was found in the ascending colon. This appeared as sessile       serrated adenoma. The polyp was Paris classification IIa (superficial,       elevated). The polyp was removed with a saline injection-lift technique        using a hot snare. The polyp was removed with a piecemeal technique       using a hot snare. Polyp resection was likely incomplete so polypectomy       site will be examined at a follow-up procedure. The resected tissue was       retrieved. To prevent bleeding post-intervention, two hemostatic clips       were successfully placed. There was no bleeding during, or at the end,       of the procedure. Area was tattooed with an injection of [place] ink.      A single large angioectasia without bleeding was found in the cecum. Impression:            - One 17 mm polyp in the ascending colon, removed                         using injection-lift and a hot snare and removed                         piecemeal using a hot snare. Incomplete resection.                         Resected tissue retrieved. Clips were placed. Tattooed.                        - A single non-bleeding colonic angioectasia. Recommendation:        - Discharge patient to home.                        - Resume previous diet.                        - Continue present medications.                        - Await pathology results.                        -  Repeat colonoscopy in 6 months for surveillance                         after piecemeal polypectomy.                        - Return to referring physician as previously                         scheduled. Procedure Code(s):     --- Professional ---                        [phone], Colonoscopy, flexible; with removal of                         tumor(s), polyp(s), or other lesion(s) by snare                         technique                        [id number], Colonoscopy, flexible; with directed submucosal                         injection(s), any substance Diagnosis Code(s):     --- Professional ---                        Z86.010, Personal history of colonic polyps                        K63.5, Polyp of colon                        K55.20, Angiodysplasia of colon without hemorrhage CPT  copyright 2019 American Medical Association. All rights reserved. The codes documented in this report are preliminary and upon coder review may  be revised to meet current compliance requirements. Andrey Farmer MD, MD 12/10/2020 10:35:38 AM Number of Addenda: 0 Note Initiated On: 12/10/2020 9:24 AM Scope Withdrawal Time: 0 hours 47 minutes 4 seconds  Total Procedure Duration: 0 hours 53 minutes 1 second  Estimated Blood Loss:  Estimated blood loss was minimal.      [facility]

## 2020-12-10 NOTE — Anesthesia Postprocedure Evaluation (Signed)
Anesthesia Post Note  Patient: Barbara Boyer  Procedure(s) Performed: COLONOSCOPY WITH PROPOFOL (N/A )  Patient location during evaluation: Phase II Anesthesia Type: General Level of consciousness: awake and alert, awake and oriented Pain management: pain level controlled Vital Signs Assessment: post-procedure vital signs reviewed and stable Respiratory status: spontaneous breathing, nonlabored ventilation and respiratory function stable Cardiovascular status: blood pressure returned to baseline and stable Postop Assessment: no apparent nausea or vomiting Anesthetic complications: no   No complications documented.   Last Vitals:  Vitals:   12/10/20 1048 12/10/20 1058  BP: 119/65 129/73  Pulse: 75 73  Resp: 19 10  Temp:    SpO2: 100% 100%    Last Pain:  Vitals:   12/10/20 1058  TempSrc:   PainSc: 0-No pain                 Phill Mutter

## 2020-12-13 ENCOUNTER — Encounter: Payer: Self-pay | Admitting: Gastroenterology

## 2020-12-13 LAB — SURGICAL PATHOLOGY

## 2021-06-07 ENCOUNTER — Other Ambulatory Visit: Payer: Self-pay | Admitting: Internal Medicine

## 2021-06-07 DIAGNOSIS — Z1231 Encounter for screening mammogram for malignant neoplasm of breast: Secondary | ICD-10-CM

## 2021-07-18 ENCOUNTER — Other Ambulatory Visit: Payer: Self-pay

## 2021-07-18 ENCOUNTER — Ambulatory Visit
Admission: RE | Admit: 2021-07-18 | Discharge: 2021-07-18 | Disposition: A | Discharge status: Home / Self Care | Payer: Medicare Other | Source: Ambulatory Visit | Attending: Internal Medicine | Admitting: Internal Medicine

## 2021-07-18 DIAGNOSIS — Z1231 Encounter for screening mammogram for malignant neoplasm of breast: Secondary | ICD-10-CM | POA: Diagnosis not present

## 2021-08-19 ENCOUNTER — Encounter: Payer: Self-pay | Admitting: *Deleted

## 2021-08-22 ENCOUNTER — Encounter: Payer: Self-pay | Admitting: *Deleted

## 2021-08-22 ENCOUNTER — Ambulatory Visit: Payer: Medicare Other | Admitting: Anesthesiology

## 2021-08-22 ENCOUNTER — Encounter
Admission: RE | Disposition: A | Discharge status: Home / Self Care | Payer: Self-pay | Source: Home / Self Care | Attending: Gastroenterology

## 2021-08-22 ENCOUNTER — Other Ambulatory Visit: Payer: Self-pay

## 2021-08-22 ENCOUNTER — Ambulatory Visit
Admission: RE | Admit: 2021-08-22 | Discharge: 2021-08-22 | Disposition: A | Discharge status: Home / Self Care | Payer: Medicare Other | Attending: Gastroenterology | Admitting: Gastroenterology

## 2021-08-22 DIAGNOSIS — Z87891 Personal history of nicotine dependence: Secondary | ICD-10-CM | POA: Diagnosis not present

## 2021-08-22 DIAGNOSIS — Z09 Encounter for follow-up examination after completed treatment for conditions other than malignant neoplasm: Secondary | ICD-10-CM | POA: Insufficient documentation

## 2021-08-22 DIAGNOSIS — D122 Benign neoplasm of ascending colon: Secondary | ICD-10-CM | POA: Diagnosis not present

## 2021-08-22 DIAGNOSIS — Z86010 Personal history of colonic polyps: Secondary | ICD-10-CM | POA: Diagnosis not present

## 2021-08-22 HISTORY — PX: COLONOSCOPY: SHX5424

## 2021-08-22 SURGERY — COLONOSCOPY
Anesthesia: General

## 2021-08-22 MED ORDER — LIDOCAINE 2% (20 MG/ML) 5 ML SYRINGE
INTRAMUSCULAR | Status: DC | PRN
  Administered 2021-08-22: 50 mg via INTRAVENOUS

## 2021-08-22 MED ORDER — PHENYLEPHRINE HCL (PRESSORS) 10 MG/ML IV SOLN
INTRAVENOUS | Status: DC | PRN
  Administered 2021-08-22: 160 ug via INTRAVENOUS
  Administered 2021-08-22: 80 ug via INTRAVENOUS
  Administered 2021-08-22: 160 ug via INTRAVENOUS

## 2021-08-22 MED ORDER — SODIUM CHLORIDE 0.9 % IV SOLN: INTRAVENOUS | Status: DC

## 2021-08-22 MED ORDER — LIDOCAINE HCL (PF) 2 % IJ SOLN
INTRAMUSCULAR | Status: AC
  Filled 2021-08-22: qty 5

## 2021-08-22 MED ORDER — PROPOFOL 500 MG/50ML IV EMUL
INTRAVENOUS | Status: DC | PRN
  Administered 2021-08-22: 150 ug/kg/min via INTRAVENOUS

## 2021-08-22 MED ORDER — STERILE WATER FOR IRRIGATION IR SOLN
Status: DC | PRN
  Administered 2021-08-22: 60 mL

## 2021-08-22 MED ORDER — PROPOFOL 10 MG/ML IV BOLUS
INTRAVENOUS | Status: DC | PRN
  Administered 2021-08-22: 70 mg via INTRAVENOUS

## 2021-08-22 MED ORDER — PROPOFOL 500 MG/50ML IV EMUL
INTRAVENOUS | Status: AC
  Filled 2021-08-22: qty 50

## 2021-08-22 NOTE — H&P (Signed)
Outpatient short stay form Pre-procedure 08/22/2021  Lesly Rubenstein, MD  Primary Physician: Adin Hector, MD  Reason for visit:  History of piecemeal resection of colon polyp  History of present illness:    76 y/o lady with history of piecemeal resection of SSA a little over 6 months ago here for f/u colonoscopy. No blood thinners. No abdominal surgeries. No significant family history of GI malignancies.    Current Facility-Administered Medications:    0.9 %  sodium chloride infusion, , Intravenous, Continuous, Turner Baillie, Hilton Cork, MD, Last Rate: 20 mL/hr at 08/22/21 0941, New Bag at 08/22/21 0941  Medications Prior to Admission  Medication Sig Dispense Refill Last Dose   Biotin 5000 MCG CAPS Take by mouth.   Past Week   Calcium Carbonate-Vitamin D (CALCIUM 500 + D PO) Take by mouth.   Past Week   Cholecalciferol 25 MCG (1000 UT) tablet Take by mouth.   Past Week   timolol (BETIMOL) 0.5 % ophthalmic solution 1 drop 2 (two) times daily.   08/22/2021   vitamin B-12 (CYANOCOBALAMIN) 500 MCG tablet Take 500 mcg by mouth daily.   Past Week   naproxen sodium (ALEVE) 220 MG tablet Take 220 mg by mouth daily as needed (pain).        Allergies  Allergen Reactions   Travatan Z [Travoprost]      Past Medical History:  Diagnosis Date   Abnormal mammogram    Cancer ([place])    skin   Glaucoma    Hyperlipidemia    Osteoarthritis    Osteopenia    Varicose veins of both lower extremities     Review of systems:  Otherwise negative.    Physical Exam  Gen: Alert, oriented. Appears stated age.  HEENT: PERRLA. Lungs: No respiratory distress CV: RRR Abd: soft, benign, no masses Ext: No edema    Planned procedures: Proceed with colonoscopy. The patient understands the nature of the planned procedure, indications, risks, alternatives and potential complications including but not limited to bleeding, infection, perforation, damage to internal organs and possible  oversedation/side effects from anesthesia. The patient agrees and gives consent to proceed.  Please refer to procedure notes for findings, recommendations and patient disposition/instructions.     Lesly Rubenstein, MD [facility]

## 2021-08-22 NOTE — Interval H&P Note (Signed)
History and Physical Interval Note:  08/22/2021 9:54 AM  Barbara Boyer  has presented today for surgery, with the diagnosis of [place].  The various methods of treatment have been discussed with the patient and family. After consideration of risks, benefits and other options for treatment, the patient has consented to  Procedure(s): COLONOSCOPY (N/A) as a surgical intervention.  The patient's history has been reviewed, patient examined, no change in status, stable for surgery.  I have reviewed the patient's chart and labs.  Questions were answered to the patient's satisfaction.     Lesly Rubenstein  Ok to proceed with colonoscopy

## 2021-08-22 NOTE — Transfer of Care (Signed)
Immediate Anesthesia Transfer of Care Note  Patient: GWENDELYN LANTING  Procedure(s) Performed: COLONOSCOPY  Patient Location: Endoscopy Unit  Anesthesia Type:General  Level of Consciousness: drowsy  Airway & Oxygen Therapy: Patient Spontanous Breathing  Post-op Assessment: Report given to RN and Post -op Vital signs reviewed and stable  Post vital signs: Reviewed and stable  Last Vitals:  Vitals Value Taken Time  BP 98/44 08/22/21 1027  Temp 35.9 C 08/22/21 1026  Pulse 73 08/22/21 1028  Resp 19 08/22/21 1028  SpO2 99 % 08/22/21 1028  Vitals shown include unvalidated device data.  Last Pain:  Vitals:   08/22/21 1026  TempSrc: Tympanic  PainSc: Asleep         Complications: No notable events documented.

## 2021-08-22 NOTE — Op Note (Signed)
[facility] Patient Name: Barbara Boyer Procedure Date: 08/22/2021 9:38 AM MRN: [id number] Account #: [id number] Date of Birth: 17-Oct-1945 Admit Type: Outpatient Age: 76 Room: [facility] ENDO ROOM 3 Gender: Female Note Status: Finalized Instrument Name: Park Meo [id number] Procedure:             Colonoscopy Indications:           Surveillance: Personal history of piecemeal removal of                         adenoma on last colonoscopy (less than 1 year ago) Providers:             Andrey Farmer MD, MD Referring MD:          Ramonita Lab, MD (Referring MD) Medicines:             Monitored Anesthesia Care Complications:         No immediate complications. Estimated blood loss:                         Minimal. Procedure:             Pre-Anesthesia Assessment:                        - Prior to the procedure, a History and Physical was                         performed, and patient medications and allergies were                         reviewed. The patient is competent. The risks and                         benefits of the procedure and the sedation options and                         risks were discussed with the patient. All questions                         were answered and informed consent was obtained.                         Patient identification and proposed procedure were                         verified by the physician, the nurse, the                         anesthesiologist, the anesthetist and the technician                         in the endoscopy suite. Mental Status Examination:                         alert and oriented. Airway Examination: normal                         oropharyngeal airway and neck mobility. Respiratory  Examination: clear to auscultation. CV Examination:                         normal. Prophylactic Antibiotics: The patient does not                         require prophylactic antibiotics. Prior                          Anticoagulants: The patient has taken no previous                         anticoagulant or antiplatelet agents. ASA Grade                         Assessment: II - A patient with mild systemic disease.                         After reviewing the risks and benefits, the patient                         was deemed in satisfactory condition to undergo the                         procedure. The anesthesia plan was to use monitored                         anesthesia care (MAC). Immediately prior to                         administration of medications, the patient was                         re-assessed for adequacy to receive sedatives. The                         heart rate, respiratory rate, oxygen saturations,                         blood pressure, adequacy of pulmonary ventilation, and                         response to care were monitored throughout the                         procedure. The physical status of the patient was                         re-assessed after the procedure.                        After obtaining informed consent, the colonoscope was                         passed under direct vision. Throughout the procedure,                         the patient's blood pressure, pulse, and oxygen  saturations were monitored continuously. The                         Colonoscope was introduced through the anus and                         advanced to the the cecum, identified by appendiceal                         orifice and ileocecal valve. The colonoscopy was                         performed without difficulty. The patient tolerated                         the procedure well. The quality of the bowel                         preparation was good. Findings:      The perianal and digital rectal examinations were normal.      A tattoo was seen in the ascending colon. The tattoo site appeared       abnormal. There was a small amount of  residual polyp (3-4 mm in size)       from previous polypectomy site. The polyp was removed with a hot snare       due to concern for scarring from previous polypectomy. Resection and       retrieval were complete. Estimated blood loss was minimal. Edges of site       was coaged with polypectomy snare. To prevent bleeding       post-intervention, two hemostatic clips were successfully placed. There       was no bleeding during, or at the end, of the procedure.      The exam was otherwise without abnormality on direct and retroflexion       views. Impression:            - A tattoo was seen in the ascending colon. The tattoo                         site appeared abnormal. Polypectomy of residual polyp.                         Clips were placed.                        - The examination was otherwise normal on direct and                         retroflexion views. Recommendation:        - Discharge patient to home.                        - Resume previous diet.                        - Continue present medications.                        - Await pathology results.                        -  Repeat colonoscopy in 1-2 years for surveillance.                        - Return to referring physician as previously                         scheduled. Procedure Code(s):     --- Professional ---                        [phone], Colonoscopy, flexible; with removal of                         tumor(s), polyp(s), or other lesion(s) by snare                         technique Diagnosis Code(s):     --- Professional ---                        Z09, Encounter for follow-up examination after                         completed treatment for conditions other than                         malignant neoplasm                        Z86.010, Personal history of colonic polyps CPT copyright 2019 American Medical Association. All rights reserved. The codes documented in this report are preliminary and upon coder review may   be revised to meet current compliance requirements. Andrey Farmer MD, MD 08/22/2021 10:30:05 AM Number of Addenda: 0 Note Initiated On: 08/22/2021 9:38 AM Estimated Blood Loss:  Estimated blood loss was minimal.      [facility]

## 2021-08-22 NOTE — Anesthesia Preprocedure Evaluation (Addendum)
Anesthesia Evaluation  Patient identified by MRN, date of birth, ID band Patient awake    Reviewed: Allergy & Precautions, NPO status , Patient's Chart, lab work & pertinent test results  History of Anesthesia Complications Negative for: history of anesthetic complications  Airway Mallampati: I   Neck ROM: Full    Dental   Upper bridges, lower partial:   Pulmonary former smoker (quit 1969),    Pulmonary exam normal breath sounds clear to auscultation       Cardiovascular Exercise Tolerance: Good negative cardio ROS Normal cardiovascular exam Rhythm:Regular Rate:Normal     Neuro/Psych negative neurological ROS     GI/Hepatic negative GI ROS,   Endo/Other  negative endocrine ROS  Renal/GU negative Renal ROS     Musculoskeletal  (+) Arthritis , Gout    Abdominal   Peds  Hematology negative hematology ROS (+)   Anesthesia Other Findings   Reproductive/Obstetrics                            Anesthesia Physical Anesthesia Plan  ASA: 2  Anesthesia Plan: General   Post-op Pain Management:    Induction: Intravenous  PONV Risk Score and Plan: 3 and Propofol infusion, TIVA and Treatment may vary due to age or medical condition  Airway Management Planned: Natural Airway  Additional Equipment:   Intra-op Plan:   Post-operative Plan:   Informed Consent: I have reviewed the patients History and Physical, chart, labs and discussed the procedure including the risks, benefits and alternatives for the proposed anesthesia with the patient or authorized representative who has indicated his/her understanding and acceptance.       Plan Discussed with: CRNA  Anesthesia Plan Comments: (LMA/GETA backup discussed.  Patient consented for risks of anesthesia including but not limited to:  - adverse reactions to medications - damage to eyes, teeth, lips or other oral mucosa - nerve damage due to  positioning  - sore throat or hoarseness - damage to heart, brain, nerves, lungs, other parts of body or loss of life  Informed patient about role of CRNA in peri- and intra-operative care.  Patient voiced understanding.)        Anesthesia Quick Evaluation

## 2021-08-22 NOTE — Anesthesia Postprocedure Evaluation (Signed)
Anesthesia Post Note  Patient: Barbara Boyer  Procedure(s) Performed: COLONOSCOPY  Patient location during evaluation: PACU Anesthesia Type: General Level of consciousness: awake and alert, oriented and patient cooperative Pain management: pain level controlled Vital Signs Assessment: post-procedure vital signs reviewed and stable Respiratory status: spontaneous breathing, nonlabored ventilation and respiratory function stable Cardiovascular status: blood pressure returned to baseline and stable Postop Assessment: adequate PO intake Anesthetic complications: no   No notable events documented.   Last Vitals:  Vitals:   08/22/21 1046 08/22/21 1056  BP: 113/75 121/68  Pulse: 71 72  Resp: 17 13  Temp:    SpO2: 100% 100%    Last Pain:  Vitals:   08/22/21 1056  TempSrc:   PainSc: 0-No pain                 Darrin Nipper

## 2021-08-23 ENCOUNTER — Encounter: Payer: Self-pay | Admitting: Gastroenterology

## 2021-08-23 LAB — SURGICAL PATHOLOGY

## 2022-06-26 ENCOUNTER — Other Ambulatory Visit: Payer: Self-pay | Admitting: Internal Medicine

## 2022-06-26 DIAGNOSIS — Z1231 Encounter for screening mammogram for malignant neoplasm of breast: Secondary | ICD-10-CM

## 2022-07-20 ENCOUNTER — Ambulatory Visit
Admission: RE | Admit: 2022-07-20 | Discharge: 2022-07-20 | Disposition: A | Discharge status: Home / Self Care | Payer: Medicare Other | Source: Ambulatory Visit | Attending: Internal Medicine | Admitting: Internal Medicine

## 2022-07-20 DIAGNOSIS — Z1231 Encounter for screening mammogram for malignant neoplasm of breast: Secondary | ICD-10-CM | POA: Diagnosis present

## 2023-03-13 ENCOUNTER — Other Ambulatory Visit: Payer: Self-pay | Admitting: Family Medicine

## 2023-03-13 DIAGNOSIS — M5416 Radiculopathy, lumbar region: Secondary | ICD-10-CM

## 2023-03-13 DIAGNOSIS — M5136 Other intervertebral disc degeneration, lumbar region: Secondary | ICD-10-CM

## 2023-03-31 ENCOUNTER — Ambulatory Visit: Admission: RE | Admit: 2023-03-31 | Payer: Medicare Other | Source: Ambulatory Visit

## 2023-03-31 DIAGNOSIS — M5416 Radiculopathy, lumbar region: Secondary | ICD-10-CM

## 2023-03-31 DIAGNOSIS — M5136 Other intervertebral disc degeneration, lumbar region: Secondary | ICD-10-CM

## 2023-05-01 ENCOUNTER — Ambulatory Visit: Payer: Medicare Other | Admitting: Neurosurgery

## 2023-06-25 ENCOUNTER — Other Ambulatory Visit: Payer: Self-pay | Admitting: Internal Medicine

## 2023-06-25 DIAGNOSIS — Z1231 Encounter for screening mammogram for malignant neoplasm of breast: Secondary | ICD-10-CM

## 2023-07-27 ENCOUNTER — Ambulatory Visit
Admission: RE | Admit: 2023-07-27 | Discharge: 2023-07-27 | Disposition: A | Discharge status: Home / Self Care | Payer: Medicare Other | Source: Ambulatory Visit | Attending: Internal Medicine | Admitting: Internal Medicine

## 2023-07-27 DIAGNOSIS — Z1231 Encounter for screening mammogram for malignant neoplasm of breast: Secondary | ICD-10-CM | POA: Diagnosis present

## 2023-11-01 ENCOUNTER — Encounter: Payer: Self-pay | Admitting: *Deleted

## 2023-11-05 ENCOUNTER — Encounter: Payer: Self-pay | Admitting: *Deleted

## 2023-11-13 ENCOUNTER — Encounter
Admission: RE | Disposition: A | Discharge status: Home / Self Care | Payer: Self-pay | Source: Home / Self Care | Attending: Gastroenterology

## 2023-11-13 ENCOUNTER — Ambulatory Visit: Admitting: Anesthesiology

## 2023-11-13 ENCOUNTER — Ambulatory Visit
Admission: RE | Admit: 2023-11-13 | Discharge: 2023-11-13 | Disposition: A | Discharge status: Home / Self Care | Payer: Medicare Other | Attending: Gastroenterology | Admitting: Gastroenterology

## 2023-11-13 ENCOUNTER — Encounter: Payer: Self-pay | Admitting: *Deleted

## 2023-11-13 ENCOUNTER — Other Ambulatory Visit: Payer: Self-pay

## 2023-11-13 DIAGNOSIS — Z87891 Personal history of nicotine dependence: Secondary | ICD-10-CM | POA: Diagnosis not present

## 2023-11-13 DIAGNOSIS — Z860101 Personal history of adenomatous and serrated colon polyps: Secondary | ICD-10-CM | POA: Diagnosis present

## 2023-11-13 DIAGNOSIS — D122 Benign neoplasm of ascending colon: Secondary | ICD-10-CM | POA: Insufficient documentation

## 2023-11-13 DIAGNOSIS — K5520 Angiodysplasia of colon without hemorrhage: Secondary | ICD-10-CM | POA: Insufficient documentation

## 2023-11-13 DIAGNOSIS — Z1211 Encounter for screening for malignant neoplasm of colon: Secondary | ICD-10-CM | POA: Insufficient documentation

## 2023-11-13 DIAGNOSIS — M1990 Unspecified osteoarthritis, unspecified site: Secondary | ICD-10-CM | POA: Diagnosis not present

## 2023-11-13 DIAGNOSIS — Z800 Family history of malignant neoplasm of digestive organs: Secondary | ICD-10-CM | POA: Insufficient documentation

## 2023-11-13 DIAGNOSIS — D128 Benign neoplasm of rectum: Secondary | ICD-10-CM | POA: Diagnosis not present

## 2023-11-13 DIAGNOSIS — D120 Benign neoplasm of cecum: Secondary | ICD-10-CM | POA: Diagnosis not present

## 2023-11-13 HISTORY — DX: Spinal stenosis, lumbar region without neurogenic claudication: M48.061

## 2023-11-13 HISTORY — DX: Age-related osteoporosis without current pathological fracture: M81.0

## 2023-11-13 HISTORY — DX: Hematuria, unspecified: R31.9

## 2023-11-13 SURGERY — COLONOSCOPY WITH PROPOFOL
Anesthesia: General

## 2023-11-13 MED ORDER — PROPOFOL 10 MG/ML IV BOLUS
INTRAVENOUS | Status: AC
  Filled 2023-11-13: qty 20

## 2023-11-13 MED ORDER — PHENYLEPHRINE 80 MCG/ML (10ML) SYRINGE FOR IV PUSH (FOR BLOOD PRESSURE SUPPORT)
PREFILLED_SYRINGE | INTRAVENOUS | Status: DC | PRN
Start: 2023-11-13 — End: 2023-11-13
  Administered 2023-11-13: 160 ug via INTRAVENOUS

## 2023-11-13 MED ORDER — SODIUM CHLORIDE 0.9 % IV SOLN: INTRAVENOUS | Status: DC

## 2023-11-13 MED ORDER — PROPOFOL 500 MG/50ML IV EMUL
INTRAVENOUS | Status: DC | PRN
  Administered 2023-11-13: 150 ug/kg/min via INTRAVENOUS
  Administered 2023-11-13: 120 ug/kg/min via INTRAVENOUS
  Administered 2023-11-13: 70 mg via INTRAVENOUS

## 2023-11-13 MED ORDER — PHENYLEPHRINE 80 MCG/ML (10ML) SYRINGE FOR IV PUSH (FOR BLOOD PRESSURE SUPPORT)
PREFILLED_SYRINGE | INTRAVENOUS | Status: AC
  Filled 2023-11-13: qty 10

## 2023-11-13 MED ORDER — LIDOCAINE HCL (PF) 2 % IJ SOLN
INTRAMUSCULAR | Status: AC
Start: 2023-11-13 — End: ?
  Filled 2023-11-13: qty 5

## 2023-11-13 NOTE — Anesthesia Postprocedure Evaluation (Signed)
 Anesthesia Post Note  Patient: Barbara Boyer  Procedure(s) Performed: COLONOSCOPY WITH PROPOFOL POLYPECTOMY, INTESTINE  Patient location during evaluation: PACU Anesthesia Type: General Level of consciousness: awake and alert, oriented and patient cooperative Pain management: pain level controlled Vital Signs Assessment: post-procedure vital signs reviewed and stable Respiratory status: spontaneous breathing, nonlabored ventilation and respiratory function stable Cardiovascular status: blood pressure returned to baseline and stable Postop Assessment: adequate PO intake Anesthetic complications: no   There were no known notable events for this encounter.   Last Vitals:  Vitals:   11/13/23 0910 11/13/23 0920  BP: 115/62 130/68  Pulse: 67   Resp: 10   Temp:    SpO2: 100%     Last Pain:  Vitals:   11/13/23 0910  TempSrc:   PainSc: 0-No pain                 Dorothey Gate

## 2023-11-13 NOTE — Op Note (Signed)
 [facility]enter[facility] Patient Name: Barbara Boyer Procedure Date: 11/13/2023 8:31 AM MRN: [id number] Account #: [id number] Date of Birth: 17-Jun-1946 Admit Type: Outpatient Age: 78 Room: [facility] ENDO ROOM 1 Gender: Female Note Status: Finalized Instrument Name: Prentice Docker [id number] Procedure:             Colonoscopy Indications:           Surveillance: History of piecemeal removal adenoma on                         last colonoscopy (< 3 yrs) Providers:             Eather Colas MD, MD Medicines:             Monitored Anesthesia Care Complications:         No immediate complications. Estimated blood loss:                         Minimal. Procedure:             Pre-Anesthesia Assessment:                        - Prior to the procedure, a History and Physical was                         performed, and patient medications and allergies were                         reviewed. The patient is competent. The risks and                         benefits of the procedure and the sedation options and                         risks were discussed with the patient. All questions                         were answered and informed consent was obtained.                         Patient identification and proposed procedure were                         verified by the physician, the nurse, the                         anesthesiologist, the anesthetist and the technician                         in the endoscopy suite. Mental Status Examination:                         alert and oriented. Airway Examination: normal                         oropharyngeal airway and neck mobility. Respiratory                         Examination: clear to auscultation. CV Examination:  normal. Prophylactic Antibiotics: The patient does not                         require prophylactic antibiotics. Prior                         Anticoagulants: The patient has taken no anticoagulant                          or antiplatelet agents. ASA Grade Assessment: II - A                         patient with mild systemic disease. After reviewing                         the risks and benefits, the patient was deemed in                         satisfactory condition to undergo the procedure. The                         anesthesia plan was to use monitored anesthesia care                         (MAC). Immediately prior to administration of                         medications, the patient was re-assessed for adequacy                         to receive sedatives. The heart rate, respiratory                         rate, oxygen saturations, blood pressure, adequacy of                         pulmonary ventilation, and response to care were                         monitored throughout the procedure. The physical                         status of the patient was re-assessed after the                         procedure.                        After obtaining informed consent, the colonoscope was                         passed under direct vision. Throughout the procedure,                         the patient's blood pressure, pulse, and oxygen                         saturations were monitored continuously. The  Colonoscope was introduced through the anus and                         advanced to the the terminal ileum, with                         identification of the appendiceal orifice and IC                         valve. The colonoscopy was performed without                         difficulty. The patient tolerated the procedure well.                         The quality of the bowel preparation was good. The                         terminal ileum, ileocecal valve, appendiceal orifice,                         and rectum were photographed. Findings:      The perianal and digital rectal examinations were normal.      The terminal ileum appeared normal.      A single  large localized angiodysplastic lesion without bleeding was       found in the cecum.      A 2 mm polyp was found in the cecum. The polyp was sessile. The polyp       was removed with a cold snare. Resection and retrieval were complete.       Estimated blood loss was minimal.      A 2 mm polyp was found in the ascending colon. The polyp was sessile.       The polyp was removed with a cold snare. Resection and retrieval were       complete. Estimated blood loss was minimal.      A tattoo was seen in the ascending colon. The tattoo site appeared       normal.      A 3 mm polyp was found in the rectum. The polyp was sessile. The polyp       was removed with a cold snare. Resection and retrieval were complete.       Estimated blood loss was minimal.      The exam was otherwise without abnormality on direct and retroflexion       views. Impression:            - The examined portion of the ileum was normal.                        - A single non-bleeding colonic angiodysplastic lesion.                        - One 2 mm polyp in the cecum, removed with a cold                         snare. Resected and retrieved.                        - One  2 mm polyp in the ascending colon, removed with                         a cold snare. Resected and retrieved.                        - A tattoo was seen in the ascending colon. The tattoo                         site appeared normal.                        - One 3 mm polyp in the rectum, removed with a cold                         snare. Resected and retrieved.                        - The examination was otherwise normal on direct and                         retroflexion views. Recommendation:        - Discharge patient to home.                        - Resume previous diet.                        - Continue present medications.                        - Await pathology results.                        - Repeat colonoscopy in 3 years for surveillance. If                          benefits outweigh risks.                        - Return to referring physician as previously                         scheduled. Procedure Code(s):     --- Professional ---                        [phone], Colonoscopy, flexible; with removal of                         tumor(s), polyp(s), or other lesion(s) by snare                         technique Diagnosis Code(s):     --- Professional ---                        Z86.010, Personal history of colonic polyps                        K55.20, Angiodysplasia of colon without hemorrhage  D12.0, Benign neoplasm of cecum                        D12.2, Benign neoplasm of ascending colon                        D12.8, Benign neoplasm of rectum CPT copyright 2022 American Medical Association. All rights reserved. The codes documented in this report are preliminary and upon coder review may  be revised to meet current compliance requirements. Leida Puna MD, MD 11/13/2023 9:01:35 AM Number of Addenda: 0 Note Initiated On: 11/13/2023 8:31 AM Scope Withdrawal Time: 0 hours 12 minutes 14 seconds  Total Procedure Duration: 0 hours 17 minutes 5 seconds  Estimated Blood Loss:  Estimated blood loss was minimal.      [facility]

## 2023-11-13 NOTE — Interval H&P Note (Signed)
 History and Physical Interval Note:  11/13/2023 8:33 AM  Barbara Boyer  has presented today for surgery, with the diagnosis of HX OF ADENOMATOUS POLYP OF COLON.  The various methods of treatment have been discussed with the patient and family. After consideration of risks, benefits and other options for treatment, the patient has consented to  Procedure(s): COLONOSCOPY WITH PROPOFOL (N/A) as a surgical intervention.  The patient's history has been reviewed, patient examined, no change in status, stable for surgery.  I have reviewed the patient's chart and labs.  Questions were answered to the patient's satisfaction.     Shane Darling  Ok to proceed with colonoscopy

## 2023-11-13 NOTE — H&P (Signed)
 Outpatient short stay form Pre-procedure 11/13/2023  Shane Darling, MD  Primary Physician: Melchor Spoon, MD  Reason for visit:  History of residual polyp  History of present illness:    78 y/o lady with history of hyperlipidemia and osteoarthritis here for colonoscopy due to history of piecemeal resection of polyp of ascending colon. Last colonoscopy in 2023. No blood thinners. Brother with colon cancer in late 30's. No significant abdominal surgeries.    Current Facility-Administered Medications:    0.9 %  sodium chloride infusion, , Intravenous, Continuous, Leslee Suire, Leanora Prophet, MD, Last Rate: 20 mL/hr at 11/13/23 0827, Continued from Pre-op at 11/13/23 0827  Medications Prior to Admission  Medication Sig Dispense Refill Last Dose/Taking   Biotin 5000 MCG CAPS Take by mouth.   Past Week   Calcium Carbonate-Vitamin D (CALCIUM 500 + D PO) Take by mouth.   Past Week   Cholecalciferol 25 MCG (1000 UT) tablet Take by mouth.   Past Week   naproxen sodium (ALEVE) 220 MG tablet Take 220 mg by mouth daily as needed (pain).   Past Week   timolol (BETIMOL) 0.5 % ophthalmic solution 1 drop 2 (two) times daily.   11/12/2023 Evening   vitamin B-12 (CYANOCOBALAMIN) 500 MCG tablet Take 500 mcg by mouth daily.   Past Week     Allergies  Allergen Reactions   Travatan Z [Travoprost]      Past Medical History:  Diagnosis Date   Abnormal mammogram    Age-related osteoporosis without current pathological fracture    Cancer (HCC)    skin   Glaucoma    Hematuria    Hyperlipidemia    Osteoarthritis    Osteopenia    Spinal stenosis of lumbar region without neurogenic claudication    Varicose veins of both lower extremities     Review of systems:  Otherwise negative.    Physical Exam  Gen: Alert, oriented. Appears stated age.  HEENT: PERRLA. Lungs: No respiratory distress CV: RRR Abd: soft, benign, no masses Ext: No edema    Planned procedures: Proceed with colonoscopy.  The patient understands the nature of the planned procedure, indications, risks, alternatives and potential complications including but not limited to bleeding, infection, perforation, damage to internal organs and possible oversedation/side effects from anesthesia. The patient agrees and gives consent to proceed.  Please refer to procedure notes for findings, recommendations and patient disposition/instructions.     Shane Darling, MD [facility]

## 2023-11-13 NOTE — Transfer of Care (Signed)
 Immediate Anesthesia Transfer of Care Note  Patient: Barbara Boyer  Procedure(s) Performed: COLONOSCOPY WITH PROPOFOL POLYPECTOMY, INTESTINE  Patient Location: PACU  Anesthesia Type:General  Level of Consciousness: drowsy  Airway & Oxygen Therapy: Patient Spontanous Breathing  Post-op Assessment: Report given to RN and Post -op Vital signs reviewed and stable  Post vital signs: Reviewed and stable  Last Vitals:  Vitals Value Taken Time  BP 105/74 11/13/23 0900  Temp    Pulse 72 11/13/23 0900  Resp 16 11/13/23 0900  SpO2 100 % 11/13/23 0900    Last Pain:  Vitals:   11/13/23 0900  TempSrc:   PainSc: Asleep         Complications: There were no known notable events for this encounter.

## 2023-11-13 NOTE — Anesthesia Preprocedure Evaluation (Addendum)
 Anesthesia Evaluation  Patient identified by MRN, date of birth, ID band Patient awake    Reviewed: Allergy & Precautions, NPO status , Patient's Chart, lab work & pertinent test results  History of Anesthesia Complications Negative for: history of anesthetic complications  Airway Mallampati: I   Neck ROM: Full    Dental  (+) Partial [place] Bridge :   Pulmonary former smoker (quit 1969)   Pulmonary exam normal breath sounds clear to auscultation       Cardiovascular Exercise Tolerance: Good negative cardio ROS Normal cardiovascular exam Rhythm:Regular Rate:Normal     Neuro/Psych negative neurological ROS     GI/Hepatic negative GI ROS,,,  Endo/Other  negative endocrine ROS    Renal/GU negative Renal ROS     Musculoskeletal  (+) Arthritis ,  Gout    Abdominal   Peds  Hematology negative hematology ROS (+)   Anesthesia Other Findings   Reproductive/Obstetrics                             Anesthesia Physical Anesthesia Plan  ASA: 2  Anesthesia Plan: General   Post-op Pain Management:    Induction: Intravenous  PONV Risk Score and Plan: 3 and Propofol infusion, TIVA and Treatment may vary due to age or medical condition  Airway Management Planned: Natural Airway  Additional Equipment:   Intra-op Plan:   Post-operative Plan:   Informed Consent: I have reviewed the patients History and Physical, chart, labs and discussed the procedure including the risks, benefits and alternatives for the proposed anesthesia with the patient or authorized representative who has indicated his/her understanding and acceptance.       Plan Discussed with: CRNA  Anesthesia Plan Comments: (LMA/GETA backup discussed.  Patient consented for risks of anesthesia including but not limited to:  - adverse reactions to medications - damage to eyes, teeth, lips or other oral mucosa - nerve damage due to  positioning  - sore throat or hoarseness - damage to heart, brain, nerves, lungs, other parts of body or loss of life  Informed patient about role of CRNA in peri- and intra-operative care.  Patient voiced understanding.)       Anesthesia Quick Evaluation

## 2023-11-14 ENCOUNTER — Encounter: Payer: Self-pay | Admitting: Gastroenterology

## 2023-11-14 LAB — SURGICAL PATHOLOGY

## 2024-06-23 ENCOUNTER — Other Ambulatory Visit: Payer: Self-pay | Admitting: Internal Medicine

## 2024-06-23 DIAGNOSIS — Z1231 Encounter for screening mammogram for malignant neoplasm of breast: Secondary | ICD-10-CM

## 2024-07-28 ENCOUNTER — Encounter

## 2024-08-28 ENCOUNTER — Ambulatory Visit
Admission: RE | Admit: 2024-08-28 | Discharge: 2024-08-28 | Disposition: A | Discharge status: Home / Self Care | Source: Ambulatory Visit | Attending: Internal Medicine | Admitting: Internal Medicine

## 2024-08-28 DIAGNOSIS — Z1231 Encounter for screening mammogram for malignant neoplasm of breast: Secondary | ICD-10-CM | POA: Diagnosis present
